# Patient Record
Sex: Male | Born: 1960
Health system: Southern US, Community
[De-identification: ages and names within clinical notes are randomized; demographics above are authoritative.]

## PROBLEM LIST (undated history)

## (undated) DIAGNOSIS — Z720 Tobacco use: Secondary | ICD-10-CM

## (undated) DIAGNOSIS — IMO0002 Reserved for concepts with insufficient information to code with codable children: Secondary | ICD-10-CM

## (undated) DIAGNOSIS — I10 Essential (primary) hypertension: Secondary | ICD-10-CM

## (undated) DIAGNOSIS — F1010 Alcohol abuse, uncomplicated: Secondary | ICD-10-CM

## (undated) DIAGNOSIS — K219 Gastro-esophageal reflux disease without esophagitis: Secondary | ICD-10-CM

## (undated) HISTORY — DX: Alcohol abuse, uncomplicated: F10.10

## (undated) HISTORY — DX: Gastro-esophageal reflux disease without esophagitis: K21.9

## (undated) HISTORY — PX: CHOLECYSTECTOMY: SHX55

## (undated) HISTORY — DX: Reserved for concepts with insufficient information to code with codable children: IMO0002

## (undated) HISTORY — DX: Tobacco use: Z72.0

## (undated) HISTORY — DX: Essential (primary) hypertension: I10

---

## 2002-05-14 ENCOUNTER — Encounter: Payer: Self-pay | Admitting: Orthopedic Surgery

## 2002-05-14 ENCOUNTER — Ambulatory Visit (HOSPITAL_COMMUNITY): Admission: RE | Admit: 2002-05-14 | Discharge: 2002-05-14 | Payer: Self-pay | Admitting: Orthopedic Surgery

## 2014-06-16 ENCOUNTER — Ambulatory Visit (INDEPENDENT_AMBULATORY_CARE_PROVIDER_SITE_OTHER): Payer: BC Managed Care – PPO | Admitting: Neurology

## 2014-06-16 ENCOUNTER — Encounter: Payer: Self-pay | Admitting: Neurology

## 2014-06-16 VITALS — BP 176/100 | HR 90 | Ht 72.84 in | Wt 277.1 lb

## 2014-06-16 DIAGNOSIS — IMO0002 Reserved for concepts with insufficient information to code with codable children: Secondary | ICD-10-CM

## 2014-06-16 DIAGNOSIS — F17200 Nicotine dependence, unspecified, uncomplicated: Secondary | ICD-10-CM

## 2014-06-16 DIAGNOSIS — M6250 Muscle wasting and atrophy, not elsewhere classified, unspecified site: Secondary | ICD-10-CM

## 2014-06-16 DIAGNOSIS — M5417 Radiculopathy, lumbosacral region: Secondary | ICD-10-CM

## 2014-06-16 DIAGNOSIS — G621 Alcoholic polyneuropathy: Secondary | ICD-10-CM

## 2014-06-16 DIAGNOSIS — R29898 Other symptoms and signs involving the musculoskeletal system: Secondary | ICD-10-CM

## 2014-06-16 LAB — CK: Total CK: 108 U/L (ref 7–232)

## 2014-06-16 LAB — VITAMIN B12: Vitamin B-12: 453 pg/mL (ref 211–911)

## 2014-06-16 LAB — COMPREHENSIVE METABOLIC PANEL
ALT: 35 U/L (ref 0–53)
AST: 31 U/L (ref 0–37)
Albumin: 4.1 g/dL (ref 3.5–5.2)
Alkaline Phosphatase: 83 U/L (ref 39–117)
BUN: 13 mg/dL (ref 6–23)
CO2: 27 mEq/L (ref 19–32)
Calcium: 8.8 mg/dL (ref 8.4–10.5)
Chloride: 104 mEq/L (ref 96–112)
Creat: 0.98 mg/dL (ref 0.50–1.35)
Glucose, Bld: 83 mg/dL (ref 70–99)
Potassium: 3.7 mEq/L (ref 3.5–5.3)
Sodium: 142 mEq/L (ref 135–145)
Total Bilirubin: 0.6 mg/dL (ref 0.2–1.2)
Total Protein: 7.2 g/dL (ref 6.0–8.3)

## 2014-06-16 LAB — SEDIMENTATION RATE: Sed Rate: 5 mm/hr (ref 0–16)

## 2014-06-16 LAB — CBC
HCT: 45.2 % (ref 39.0–52.0)
Hemoglobin: 15.9 g/dL (ref 13.0–17.0)
MCH: 32.3 pg (ref 26.0–34.0)
MCHC: 35.2 g/dL (ref 30.0–36.0)
MCV: 91.9 fL (ref 78.0–100.0)
Platelets: 222 10*3/uL (ref 150–400)
RBC: 4.92 MIL/uL (ref 4.22–5.81)
RDW: 14.2 % (ref 11.5–15.5)
WBC: 10.3 10*3/uL (ref 4.0–10.5)

## 2014-06-16 LAB — C-REACTIVE PROTEIN: CRP: 1 mg/dL — ABNORMAL HIGH (ref <0.60)

## 2014-06-16 LAB — TSH: TSH: 1.231 u[IU]/mL (ref 0.350–4.500)

## 2014-06-16 NOTE — Progress Notes (Signed)
Note faxed.

## 2014-06-16 NOTE — Progress Notes (Signed)
[facility] Clinic Note - Initial Visit   Date: 06/16/2014  Joel Klein MRN: [id number] DOB: [birth year]   Dear Dr. Lin Landsman:  Thank you for your kind referral of Joel Klein for consultation of bilateral leg weakness. Although his history is well known to you, please allow [place] to reiterate it for the purpose of our medical record. The patient was accompanied to the clinic by self.    History of Present Illness: Joel Klein is a 53 y.o. left-handed Caucasian male with history of lumbar disc herniation, GERD, hypertension, hyperlipidemia, tobacco use, and alcohol use presenting for evaluation of bilateral leg weakness and numbness/tingling.    Starting in the fall of 2014, he developed gradual onset of left hip pain and over several months developed bilateral leg weakness involving the thighs, lower legs, and feet (right > left).  He has muscle cramping and pressure like pain all over this legs and bones (shin, hips).  Early 2015, his weakness has steadily worsened.  He has difficulty walking on uneven surfaces.  He was falling about 3-4 times during the winter and starting using a cane in June 2015.  He feels that the cane helps and probably would have fallen more without using it.  When he has fallen, he has a very difficult time getting back up.   Around Spring 2015, he noticed numbness tingling of the feet and start loosing the ability to control foot movements.  He has mild weakness and numbness of the hands.  He has a tendency to drop things. He was previously working 6-days and went down to 5-day in April and in July cut back to 4-days per week.  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 He has not been in any motor vehicle accidents.  He does not have any problems swallowing or talking.  He has lost 35lb over the winter but attributes this to gall bladder infection and underwent cholecystectomy in April 2015.    In October 1992,  he developed sharp shooting back pain and was found to have lumbar disc herniation.  He has three steroid epidural injections which alleviated the pain.  There was no weakness at that time.  There is a strong personal history of alcohol and tobacco use. He reports drinking almost half a pint of liquor nightly for 30 years and has 120 pack year history. He is not interesting in quitting at this time.   Past Medical History  Diagnosis Date  . Herniated disc   . Hypertension   . GERD (gastroesophageal reflux disease)   . Tobacco abuse     Past Surgical History  Procedure Laterality Date  . Cholecystectomy       Medications:  No current outpatient prescriptions on file prior to visit.   No current facility-administered medications on file prior to visit.    Allergies:  Allergies  Allergen Reactions  . Penicillins     Family History: Family History  Problem Relation Age of Onset  . Cancer Maternal Aunt   . Congestive Heart Failure Mother     Deceased  . Stroke Father     Deceased  . Hypertension Brother   . Hypertension Brother     Social History: History   Social History  . Marital Status: Single    Spouse Name: N/A    Number of Children: 0  . Years of Education: N/A   Occupational History  . Not on file.   Social History Main  Topics  . Smoking status: Current Every Day Smoker -- 3.00 packs/day for 40 years    Types: Cigarettes  . Smokeless tobacco: Never Used  . Alcohol Use: Yes     Comment: Half-pint liquor nightly x 30  . Drug Use: No  . Sexual Activity: Not on file   Other Topics Concern  . Not on file   Social History Narrative   He lives alone.  No children.   He works as a Administrator.   Highest level of education:  9th grade    Review of Systems:  CONSTITUTIONAL: No fevers, chills, night sweats, or weight loss.   EYES: No visual changes or eye pain ENT: No hearing changes.  No history of nose bleeds.   RESPIRATORY: No cough, wheezing  and shortness of breath.   CARDIOVASCULAR: Negative for chest pain, and palpitations.   GI: Negative for abdominal discomfort, blood in stools or black stools.  No recent change in bowel habits.   GU:  No history of incontinence.   MUSCLOSKELETAL: +history of joint pain or swelling.  +myalgias.   SKIN: Negative for lesions, rash, and itching.   HEMATOLOGY/ONCOLOGY: Negative for prolonged bleeding, bruising easily, and swollen nodes.   ENDOCRINE: Negative for cold or heat intolerance, polydipsia or goiter.   PSYCH:  No depression or anxiety symptoms.   NEURO: As Above.   Vital Signs:  BP 176/100  Pulse 90  Ht 6' 0.83" (1.85 m)  Wt 277 lb 1 oz (125.675 kg)  BMI 36.72 kg/m2  SpO2 98%   General Medical Exam:   General:  Well appearing, comfortable.   Eyes/ENT: see cranial nerve examination.   Neck: No masses appreciated.  Full range of motion without tenderness.  No carotid bruits. Respiratory:  Reduced breath sounds at bases mild wheezes on the right.   Cardiac:  Regular rate and rhythm, no murmur.    Extremities:  No deformities, edema, or skin discoloration. Good capillary refill.   Skin:  Skin color, texture, turgor normal. No rashes or lesions.  Neurological Exam: MENTAL STATUS including orientation to time, place, person, recent and remote memory, attention span and concentration, language, and fund of knowledge is normal.  Speech is not dysarthric.  CRANIAL NERVES: II:  No visual field defects.  Unremarkable fundi.   III-IV-VI: Pupils equal round and reactive to light.  Normal conjugate, extra-ocular eye movements in all directions of gaze.  No nystagmus.  Subtle right ptosis.   V:  Normal facial sensation.  Jaw jerk is absent.   VII:  Normal facial symmetry and movements.  No pathologic facial reflexes.  VIII:  Normal hearing and vestibular function.   IX-X:  Normal palatal movement.   XI:  Normal shoulder shrug and head rotation.   XII:  Normal tongue strength and range  of motion, no deviation or fasciculation.  MOTOR:  Bilateral quadriceps atrophy.  No fasciculations or abnormal movements.  No pronator drift.     Right Upper Extremity:    Left Upper Extremity:    Deltoid  5/5   Deltoid  5/5   Biceps  5/5   Biceps  5/5   Triceps  5/5   Triceps  5/5   Wrist extensors  5/5   Wrist extensors  5/5   Wrist flexors  5-/5   Wrist flexors  5-/5   Finger extensors  5/5   Finger extensors  5/5   Finger flexors  5-/5   Finger flexors  5-/5   Dorsal interossei  4+/5   Dorsal interossei  4+/5   Abductor pollicis  5-/5   Abductor pollicis  5-/5   Tone (Ashworth scale)  0  Tone (Ashworth scale)  0   Right Lower Extremity:    Left Lower Extremity:    Hip flexors  4/5   Hip flexors  5-/5   Hip extensors  4+/5   Hip extensors  5/5   Abductor 5/5  Abductor 5/5  Adductor 4+/5  Adductor 4+/5  Knee flexors  4+/5   Knee flexors  5-/5   Knee extensors  5/5   Knee extensors  5/5   Dorsiflexors  3+/5   Dorsiflexors  3+/5   Plantarflexors  5/5   Plantarflexors  5/5   Toe extensors  3+/5   Toe extensors  4-/5   Toe flexors  5/5   Toe flexors  5/5   Tone (Ashworth scale)  0+  Tone (Ashworth scale)  0+   MSRs:  Right                                                                 Left brachioradialis 2+  brachioradialis 3+  biceps 2+  biceps 3+  triceps 2+  triceps 2+  patellar 3+  patellar 3+  ankle jerk 2+  ankle jerk 2+  Hoffman no  Hoffman no  plantar response down  plantar response down   SENSORY:  Vibration 80% at knees, 50% at ankle (worse on right), and absent at great toe.  Pin prick, temperature, and proprioception intact.  Romberg's sign mildly positive.   COORDINATION/GAIT: Normal finger-to- nose-finger.  Heel tapping intact.  Toe tapping severely reduced due to weakness.  With effort, he is able to rise from a chair without using arms.  Gait is antalgic, wide-based, and high steppage bilaterally.     IMPRESSION: Mr. Rinker is a 53 year old gentleman with  history of lumbar disc herniation, alcohol abuse, and tobacco use presenting for evaluation of bilateral leg weakness and paresthesias. His examination shows motor deficits involving the proximal and distal muscles of the lower extremity, worse on the right side. There is also generalized hyperreflexia throughout and increased tone in the legs. Vibration is diminished distal to the knees bilaterally. Taste in his history and exam, I suspect that he that he has both (1) lumbosacral myelopathy from central canal stenosis causing proximal leg weakness and (2) length dependent peripheral neuropathy due to chronic alcohol use causing distal glove stocking pattern of sensorimotor deficits.  Regarding his proximal leg weakness, I would like to obtain imaging of the lumbar spine as he has a history of disc herniations which was last imaged in the early 1990s.   Labs will be checked looking for treatable causes of neuropathy and screening studies for myopathy.  I had extensive discussion with the patient regarding the pathogenesis, etiology, management, and natural course of neuropathy. Neuropathy tends to be slowly progressive, especially if a treatable etiology is not identified. In his case, underlying etiology is most likely from chronic alcohol use and have strongly encouraged him to cut down on his consumption.  Going forward, I have discussed that his leg weakness although is not prohibiting him from driving at this time, it will likely be a topic of discussion in the future based  on the results of his tests. If this, indeed, is something hat is not reversible, we will need to discuss work restrictions.   PLAN/RECOMMENDATIONS:  1.  Check CBC, CMP, ESR, CRP, CK, aldolase, TSH, vitamin B1, vitamin B12, zinc, copper, 2-hr glucose tolerance test, SPEP/UPEP with IFE 2.  MRI lumbar spine wo contrast 3.  NCS/EMG right arm and leg - schedule at 3pm 4.  Strongly encouraged to stop drinking and smoking 5.   Recommend being very cautious with driving.  Discussed potential work restriction based on what comes of his testing 6.  Literature on fall precautions provided  7.  Discussed PT, however patient would like to hold off until workup is complete  8.  Return to clinic 6-weeks    The duration of this appointment visit was 60 minutes of face-to-face time with the patient.  Greater than 50% of this time was spent in counseling, explanation of diagnosis, planning of further management, and coordination of care.   Thank you for allowing me to participate in patient's care.  If I can answer any additional questions, I would be pleased to do so.    Sincerely,    Donika K. Posey Pronto, DO

## 2014-06-16 NOTE — Patient Instructions (Addendum)
1.  MRI lumbar spine wo contrast 2.  NCS/EMG right arm and leg - schedule at 3pm 3.  Check blood work  4.  Strongly encouraged to stop drinking and smoking 5.  Recommend being very cautious with driving 6.  Return to clinic [facility]  Preventing Falls in the Home   Falls are common, often dreaded events in the lives of older people. Aside from the obvious injuries and even death that may result, falls can cause wide-ranging consequences including loss of independence, mental decline, decreased activity, and mobility. Younger people are also at risk of falling, especially those with chronic illnesses and fatigue.  Ways to reduce the risk for falling:  * Examine diet and medications. Warm foods and alcohol dilate blood vessels, which can lead to dizziness when standing. Sleep aids, antidepressants, and pain medications can also increase the likelihood of a fall.  * Get a vison exam. Poor vision, cataracts, and glaucoma increase the chances of falling.  * Check foot gear. Shoes should fit snugly and have a sturdy, nonskid sole and broad, low heel.  * Participate in a physician-approved exercise program to build and maintain muscle strength and improve balance and coordination.  * Increase vitamin D intake. Vitamin D improves muscle strength and increases the amount of calcium the body is able to absorb and deposit in bones.  How to prevent falls from common hazards:  * Floors - Remove all loose wires, cords, and throw rugs. Minimize clutter. Make sure rugs are anchored and smooth. Keep furniture in its usual place.  * Chairs - Use chairs with straight backs, armrests, and firm seats. Add firm cushions to existing pieces to add height.  * Bathroom - Install grab bars and non-skid tape in the tub or shower. Use a bathtub transfer bench or a shower chair with a back support. Use an elevated toilet seat and/or safety rails to assist standing from a low surface. Do not use towel racks  or bathroom tissue holders to help you stand.  * Lighting - Make sure halls, stairways, and entrances are well-lit. Install a night light in your bathroom or hallway. Make sure there is a light switch at the top and bottom of the staircase. Turn lights on if you get up in the middle of the night. Make sure lamps or light switches are within reach of the bed if you have to get up during the night.  * Kitchen - Install non-skid rubber mats near the sink and stove. Clean spills immediately. Store frequently used utensils, pots, and pans between waist and eye level. This helps prevent reaching and bending. Sit when getting things out of the lower cupboards.  * Living room / Melissa furniture with wide spaces in between, giving enough room to move around. Establish a route through the living room that gives you something to hold onto as you walk.  * Stairs - Make sure treads, rails, and rugs are secure. Install a rail on both sides of the stairs. If stairs are a threat, it might be helpful to arrange most of your activities on the lower level to reduce the number of times you must climb the stairs.  * Entrances and doorways - Install metal handles on the walls adjacent to the doorknobs of all doors to make it more secure as you travel through the doorway.  Tips for maintaining balance:  * Keep at least one hand free at all times Try using a backpack  or fanny pack to hold things rather than carrying them in your hands. Never carry objects in both hands when walking as this interferes with keeping your balance.  * Attempt to swing both arms from front to back while walking. This might require a conscious effort if Parkinson's disease has diminished your movement. It will, however, help you to maintain balance and posture, and reduce fatigue.  * Consciously lift your feet off the ground when walking. Shuffling and dragging of the feet is a common culprit in losing your balance.  * When trying to navigate  turns, use a "U" technique of facing forward and making a wide turn, rather than pivoting sharply.  * Try to stand with your feet shoulder-length apart. When your feet are close together for any length of time, you increase your risk of losing your balance and falling.  * Do one thing at a time. Do not try to walk and accomplish another task, such as reading or looking around. The decrease in your automatic reflexes complicates motor function, so the less distraction, the better.  * Do not wear rubber or gripping soled shoes, they might "catch" on the floor and cause tripping.  * Move slowly when changing positions. Use deliberate, concentrated movements and, if needed, use a grab bar or walking aid. Count fifteen (15) seconds after standing to begin walking.  * If balance is a continuous problem, you might want to consider a walking aid such as a cane, walking stick, or walker. Once you have mastered walking with help, you may be ready to try it again on your own.  This information is provided by [facility] and is not intended to replace the medical advice of your physician or other health care providers. Please consult your physician or other health care providers for advice regarding your specific medical condition.

## 2014-06-19 ENCOUNTER — Other Ambulatory Visit: Payer: Self-pay | Admitting: *Deleted

## 2014-06-19 ENCOUNTER — Telehealth: Payer: Self-pay | Admitting: Family Medicine

## 2014-06-19 DIAGNOSIS — R29898 Other symptoms and signs involving the musculoskeletal system: Secondary | ICD-10-CM

## 2014-06-19 LAB — UIFE/LIGHT CHAINS/TP QN, 24-HR UR
Albumin, U: DETECTED
Alpha 1, Urine: DETECTED — AB
Alpha 2, Urine: DETECTED — AB
Beta, Urine: DETECTED — AB
FREE LAMBDA LT CHAINS, UR: 0.05 mg/dL (ref 0.02–0.67)
Free Kappa Lt Chains,Ur: 0.96 mg/dL (ref 0.14–2.42)
Free Kappa/Lambda Ratio: 19.2 ratio — ABNORMAL HIGH (ref 2.04–10.37)
GAMMA UR: DETECTED — AB
Total Protein, Urine: 3.5 mg/dL

## 2014-06-19 LAB — ZINC: ZINC: 71 ug/dL (ref 60–130)

## 2014-06-19 LAB — COPPER, SERUM: Copper: 121 ug/dL (ref 70–175)

## 2014-06-19 NOTE — Telephone Encounter (Signed)
Called patient to notify him of MRI appt scheduled @ [facility] on 7/30 @ 2:30pmo arrive at 2:00pm. I also notified him that he could go for the glucose test any day of the week he just needs to arrive @ 7:15am to outpatient lab @ [facility].

## 2014-06-20 LAB — SPEP & IFE WITH QIG
Albumin ELP: 54.8 % — ABNORMAL LOW (ref 55.8–66.1)
Alpha-1-Globulin: 5.1 % — ABNORMAL HIGH (ref 2.9–4.9)
Alpha-2-Globulin: 12.1 % — ABNORMAL HIGH (ref 7.1–11.8)
BETA 2: 5.2 % (ref 3.2–6.5)
Beta Globulin: 6.6 % (ref 4.7–7.2)
GAMMA GLOBULIN: 16.2 % (ref 11.1–18.8)
IGA: 289 mg/dL (ref 68–379)
IGM, SERUM: 36 mg/dL — AB (ref 41–251)
IgG (Immunoglobin G), Serum: 1060 mg/dL (ref 650–1600)
M-SPIKE, %: 0.21 g/dL
Total Protein, Serum Electrophoresis: 7.2 g/dL (ref 6.0–8.3)

## 2014-06-20 LAB — ALDOLASE: ALDOLASE: 8.5 U/L — AB (ref <=8.1)

## 2014-06-20 LAB — VITAMIN B1: Vitamin B1 (Thiamine): 8 nmol/L (ref 8–30)

## 2014-06-23 ENCOUNTER — Telehealth: Payer: Self-pay | Admitting: Neurology

## 2014-06-23 NOTE — Telephone Encounter (Signed)
MRI lumbar spine without contrast report received from [facility] dated 06/21/2014: Marked multilevel spondylosis of the lumbar spine with moderately severe to severe congenital and acquired central canal and lateral recess narrowing from T12-L1-L5-S1. There is also foraminal narrowing at L4-5 and L5-S1. Speckled appearance within the central canal at T11-12 mid L1 is likely due to CSF interposed between entrapped nerve root.  Called patient with the results of his blood testing thus far which shows monoclonal IgA kappa protein.  Going forward refer him to hematology for evaluation, however we'll complete electrodiagnostic testing first.  Donika K. Posey Pronto, DO

## 2014-07-03 ENCOUNTER — Ambulatory Visit (INDEPENDENT_AMBULATORY_CARE_PROVIDER_SITE_OTHER): Payer: BC Managed Care – PPO | Admitting: Neurology

## 2014-07-03 DIAGNOSIS — G621 Alcoholic polyneuropathy: Secondary | ICD-10-CM

## 2014-07-03 DIAGNOSIS — M6250 Muscle wasting and atrophy, not elsewhere classified, unspecified site: Secondary | ICD-10-CM

## 2014-07-03 DIAGNOSIS — R29898 Other symptoms and signs involving the musculoskeletal system: Secondary | ICD-10-CM

## 2014-07-03 DIAGNOSIS — F17200 Nicotine dependence, unspecified, uncomplicated: Secondary | ICD-10-CM

## 2014-07-03 DIAGNOSIS — M5417 Radiculopathy, lumbosacral region: Secondary | ICD-10-CM

## 2014-07-03 NOTE — Procedures (Signed)
[facility]  [place]  [place], [place] [address] Tel: [phone] Fax:  [phone] Test Date:  07/03/2014  Patient: Joel Klein DOB: February 27, 1961 Physician: Narda Amber, DO  Sex: Male Height: 6\' 1"  Ref Phys: Narda Amber  ID#: [id number] Temp: 32.0C Technician: Laureen Ochs R. NCS T.   Patient Complaints: This is a 53 year old gentleman presenting with bilateral feet paresthesias and leg weakness.   NCV & EMG Findings: Extensive electrodiagnostic testing of the right upper extremity and right lower extremity is somewhat limited due to patient's intolerance of testing.  Findings are as follows:  1. Median sensory response shows prolonged latency (3.8 ms) with preserved amplitude. The ulnar sensory response shows prolonged latency (3.5 ms) and reduced amplitude (5.1 V).  The dorsal cutaneous sensory response is also reduced in amplitude. Radial sensory response is within normal limits 2. The sural and superficial peroneal sensory responses are within normal limits. 3. The right ulnar motor nerve showed reduced amplitude (6.1 mV) and decreased conduction velocity (A Elbow-B Elbow, 38 m/s).  The median motor response is normal. 4. The peroneal motor response recording at both the extensor digitorum brevis and tibialis anterior is reduced.  Tibial motor responses within normal limits. 5. F Wave studies indicate that the right ulnar F wave has prolonged latency (36.27 ms).  All remaining F Wave latencies were within normal limits.   6. In the arms, chronic motor axon loss changes are seen affecting the abductor digiti minimi muscle, without accompanied active denervation. 7. In the legs, chronic motor axon loss changes are seen affecting predominantly L5-S1 myotomes, with active changes restricted to the medial gastrocnemius muscle.   Impression: 1. Right median neuropathy at or distal to the wrist, consistent with the clinical diagnosis of carpal tunnel syndrome.  Overall these findings are mild in degree electrically. 2. Right ulnar neuropathy at the elbow, demyelinating and axon loss in type. Overall, these findings are moderate in degree electrically. 3. Multilevel lumbosacral intraspinal canal lesions (i.e. radiculopathy, anterior horn cell, etc) predominantly affecting the right L5 > S1 nerve roots/segments, with active changes affecting medial gastrocnemius muscle.  These findings are moderately severe in degree. 4. There is no evidence of a generalized sensorimotor affecting the right side.   ___________________________ Narda Amber, DO    Nerve Conduction Studies Anti Sensory Summary Table   Site NR Peak (ms) Norm Peak (ms) P-T Amp (V) Norm P-T Amp  Right DorsCutan Anti Sensory (Dorsum 5th MC)  32C  Wrist    2.0 <3.1 6.8 >10  Right Median Anti Sensory (2nd Digit)  32C  Wrist    3.8 <3.6 25.2 >15  Right Radial Anti Sensory (Base 1st Digit)  32C  Wrist    1.9 <2.7 42.0 >14  Right Sup Peroneal Anti Sensory (Ant Lat Mall)  12 cm    2.7 <4.6 24.3 >4  Right Sural Anti Sensory (Lat Mall)  Calf    3.7 <4.6 13.2 >4  Right Ulnar Anti Sensory (5th Digit)  32C  Wrist    3.5 <3.1 5.1 >10   Motor Summary Table   Site NR Onset (ms) Norm Onset (ms) O-P Amp (mV) Norm O-P Amp Site1 Site2 Delta-0 (ms) Dist (cm) Vel (m/s) Norm Vel (m/s)  Right Median Motor (Abd Poll Brev)  32C  Wrist    4.0 <4.0 6.0 >6 Elbow Wrist 5.5 29.5 54 >50  Elbow    9.5  4.6         Right Peroneal Motor (  Ext Dig Brev)  32C  Ankle    6.0 <6.0 0.4 >2.5 B Fib Ankle 8.6 35.0 41 >40  B Fib    14.6  0.4  Poplt B Fib 2.3 10.0 43 >40  Poplt    16.9  0.4         Right Peroneal TA Motor (Tib Ant)  32C  Fib Head    4.3 <4.5 2.5 >3 Poplit Fib Head 2.1 10.0 48 >40  Poplit    6.4  2.1         Right Tibial Motor (Abd Hall Brev)  32C  Ankle    3.8 <6.0 8.0 >4 Knee Ankle 9.6 42.0 44 >40  Knee    13.4  5.3         Right Ulnar Motor (Abd Dig Minimi)  32C  Wrist    2.7 <3.1 6.1  >7 B Elbow Wrist 5.6 28.0 50 >50  B Elbow    8.3  5.4  A Elbow B Elbow 2.6 10.0 38 >50  A Elbow    10.9  4.6          F Wave Studies   NR F-Lat (ms) Lat Norm (ms) L-R F-Lat (ms)  Right Tibial (Mrkrs) (Abd Hallucis)  32C     34.46 <55   Right Ulnar (Mrkrs) (Abd Dig Min)  32C     36.27 <33    EMG   Side Muscle Ins Act Fibs Psw Fasc Number Recrt Dur Dur. Amp Amp. Poly Poly. Comment  Right 1stDorInt Nml Nml Nml Nml Nml Nml Nml Nml Nml Nml Nml Nml N/A  Right ABD Dig Min Nml Nml Nml Nml 1- Rapid Many 1+ Many 1+ Nml Nml N/A  Right PronatorTeres Nml Nml Nml Nml Nml Nml Nml Nml Nml Nml Nml Nml N/A  Right Abd Poll Brev Nml Nml Nml Nml Nml Nml Nml Nml Nml Nml Nml Nml N/A  Right AntTibialis 1+ Nml Nml Nml 2- Rapid Most 1+ Many 1+ Many 1+ N/A  Right Flex Dig Long Nml Nml Nml Nml 3- Rapid Most 1+ Most 1+ Nml Nml N/A  Right Gastroc Nml Nml 1+ Nml 1- Rapid Many 1+ Nml Nml Nml Nml N/A  Right GluteusMed Nml Nml Nml Nml 1- Rapid Many 1+ Few 1+ Nml Nml N/A  Right RectFemoris Nml Nml Nml Nml Nml Nml Nml Nml Nml Nml Nml Nml N/A      Waveforms:

## 2014-07-04 ENCOUNTER — Other Ambulatory Visit: Payer: Self-pay | Admitting: *Deleted

## 2014-07-04 DIAGNOSIS — M5417 Radiculopathy, lumbosacral region: Secondary | ICD-10-CM

## 2014-07-04 NOTE — Progress Notes (Signed)
Addendum  EMG of the right arm and leg 07/03/2014: 1.  Right median neuropathy at or distal to the wrist, consistent with the clinical diagnosis of carpal tunnel syndrome. Overall these findings are mild in degree electrically.  2.  Right ulnar neuropathy at the elbow, demyelinating and axon loss in type. Overall, these findings are moderate in degree electrically. 3.  Multilevel lumbosacral intraspinal canal lesions (i.e. radiculopathy, anterior horn cell, etc) predominantly affecting the right L5 > S1 nerve roots/segments, with active changes affecting medial gastrocnemius muscle. These findings are moderately severe in degree.  4.  There is no evidence of a generalized sensorimotor affecting the right side.  MRI lumbar spine without contrast report received from [facility] dated 06/21/2014:  Marked multilevel spondylosis of the lumbar spine with moderately severe to severe congenital and acquired central canal and lateral recess narrowing from T12-L1-L5-S1. There is also foraminal narrowing at L4-5 and L5-S1.  Speckled appearance within the central canal at T11-12 mid L1 is likely due to CSF interposed between entrapped nerve root.   Labs notable for monoclonal IgA kappa protein, which can be rechecked SPEP/UPEP in 69-months, given no evidence of neuropathy.   I have called and discussed patient's EMG, MRI lumbar spine, and serology results as noted above.  His leg weakness is due to central canal stenosis and foraminal narrowing of the lumbar spine, especially at L5-S1 nerve segments.  To my surprise, NCS showed normal sensory responses in the legs arguing against large fiber neuropathy causing distal weakness.   I would like him to start PT for leg strengthening and he would also like to surgical opinion.    Patient requesting referral to Dr. Ophelia Charter, MD with [facility] for surgical opinion.  Donika K. Posey Pronto, DO

## 2014-07-04 NOTE — Progress Notes (Signed)
PT ordered and referral form sent to [facility].

## 2014-07-05 ENCOUNTER — Telehealth: Payer: Self-pay | Admitting: *Deleted

## 2014-07-05 NOTE — Telephone Encounter (Signed)
1 

## 2014-07-05 NOTE — Telephone Encounter (Signed)
Becky with Dr. Adline Mango office called to let me know that patient is set up for an appointment on September 8 at 11:00.   Called patient to notify him.

## 2014-07-06 ENCOUNTER — Encounter: Payer: Self-pay | Admitting: Neurology

## 2014-07-14 ENCOUNTER — Encounter: Payer: Self-pay | Admitting: Neurology

## 2014-07-28 ENCOUNTER — Telehealth: Payer: Self-pay | Admitting: Neurology

## 2014-07-28 ENCOUNTER — Ambulatory Visit: Payer: BC Managed Care – PPO | Admitting: Neurology

## 2014-07-28 NOTE — Telephone Encounter (Signed)
Noted  

## 2014-07-28 NOTE — Telephone Encounter (Signed)
Pt called at 8:32AM on 07/28/14 stating that his truck will not crank and will not be able to make his appt for today on 07/28/14.

## 2014-07-28 NOTE — Telephone Encounter (Signed)
Pt no showed 07/28/14 appt with Dr. Posey Pronto.  Melissa Noon - please send no show letter to pt / Sherri S.

## 2014-08-11 ENCOUNTER — Other Ambulatory Visit: Payer: Self-pay | Admitting: Neurosurgery

## 2014-08-11 DIAGNOSIS — M48061 Spinal stenosis, lumbar region without neurogenic claudication: Secondary | ICD-10-CM

## 2014-08-11 DIAGNOSIS — R292 Abnormal reflex: Secondary | ICD-10-CM

## 2014-08-13 ENCOUNTER — Other Ambulatory Visit: Payer: BC Managed Care – PPO

## 2014-08-16 ENCOUNTER — Encounter: Payer: Self-pay | Admitting: Neurology

## 2014-08-19 ENCOUNTER — Other Ambulatory Visit: Payer: BC Managed Care – PPO

## 2014-08-21 ENCOUNTER — Other Ambulatory Visit: Payer: BC Managed Care – PPO

## 2014-08-24 ENCOUNTER — Encounter: Payer: BC Managed Care – PPO | Admitting: Neurology

## 2014-08-27 ENCOUNTER — Ambulatory Visit
Admission: RE | Admit: 2014-08-27 | Discharge: 2014-08-27 | Disposition: A | Discharge status: Home / Self Care | Payer: BC Managed Care – PPO | Source: Ambulatory Visit | Attending: Neurosurgery | Admitting: Neurosurgery

## 2014-08-27 DIAGNOSIS — R292 Abnormal reflex: Secondary | ICD-10-CM

## 2014-08-29 ENCOUNTER — Inpatient Hospital Stay
Admission: RE | Admit: 2014-08-29 | Discharge: 2014-08-29 | Disposition: A | Discharge status: Home / Self Care | Payer: BC Managed Care – PPO | Source: Ambulatory Visit | Attending: Neurosurgery | Admitting: Neurosurgery

## 2014-08-29 ENCOUNTER — Other Ambulatory Visit: Payer: BC Managed Care – PPO

## 2014-08-29 NOTE — Discharge Instructions (Signed)
Myelogram Discharge Instructions  1. Go home and rest quietly for the next 24 hours.  It is important to lie flat for the next 24 hours.  Get up only to go to the restroom.  You may lie in the bed or on a couch on your back, your stomach, your left side or your right side.  You may have one pillow under your head.  You may have pillows between your knees while you are on your side or under your knees while you are on your back.  2. DO NOT drive today.  Recline the seat as far back as it will go, while still wearing your seat belt, on the way home.  3. You may get up to go to the bathroom as needed.  You may sit up for 10 minutes to eat.  You may resume your normal diet and medications unless otherwise indicated.  Drink lots of extra fluids today and tomorrow.  4. The incidence of headache, nausea, or vomiting is about 5% (one in 20 patients).  If you develop a headache, lie flat and drink plenty of fluids until the headache goes away.  Caffeinated beverages may be helpful.  If you develop severe nausea and vomiting or a headache that does not go away with flat bed rest, call [phone].  5. You may resume normal activities after your 24 hours of bed rest is over; however, do not exert yourself strongly or do any heavy lifting tomorrow. If when you get up you have a headache when standing, go back to bed and force fluids for another 24 hours.  6. Call your physician for a follow-up appointment.  The results of your myelogram will be sent directly to your physician by the following day.  7. If you have any questions or if complications develop after you arrive home, please call [phone].  Discharge instructions have been explained to the patient.  The patient, or the person responsible for the patient, fully understands these instructions.      May resume Lexapro on Oct. 7, 2015, after 1:00 pm.

## 2014-09-01 ENCOUNTER — Other Ambulatory Visit: Payer: BC Managed Care – PPO

## 2014-11-20 ENCOUNTER — Ambulatory Visit
Admission: RE | Admit: 2014-11-20 | Discharge: 2014-11-20 | Disposition: A | Discharge status: Home / Self Care | Payer: BC Managed Care – PPO | Source: Ambulatory Visit | Attending: Neurosurgery | Admitting: Neurosurgery

## 2014-11-20 DIAGNOSIS — M48061 Spinal stenosis, lumbar region without neurogenic claudication: Secondary | ICD-10-CM

## 2014-11-20 MED ORDER — ONDANSETRON HCL 4 MG/2ML IJ SOLN
4.0000 mg | Freq: Once | INTRAMUSCULAR | Status: AC
  Administered 2014-11-20: 4 mg via INTRAMUSCULAR

## 2014-11-20 MED ORDER — MEPERIDINE HCL 100 MG/ML IJ SOLN
100.0000 mg | Freq: Once | INTRAMUSCULAR | Status: AC
  Administered 2014-11-20: 100 mg via INTRAMUSCULAR

## 2014-11-20 MED ORDER — DIAZEPAM 5 MG PO TABS
10.0000 mg | ORAL_TABLET | Freq: Once | ORAL | Status: AC
  Administered 2014-11-20: 10 mg via ORAL

## 2014-11-20 MED ORDER — IOHEXOL 180 MG/ML  SOLN
15.0000 mL | Freq: Once | INTRAMUSCULAR | Status: AC | PRN
  Administered 2014-11-20: 15 mL via INTRATHECAL

## 2014-11-20 NOTE — Discharge Instructions (Signed)

## 2017-01-05 DIAGNOSIS — R0602 Shortness of breath: Secondary | ICD-10-CM | POA: Diagnosis not present

## 2017-01-06 DIAGNOSIS — M1990 Unspecified osteoarthritis, unspecified site: Secondary | ICD-10-CM | POA: Diagnosis not present

## 2017-01-06 DIAGNOSIS — I509 Heart failure, unspecified: Secondary | ICD-10-CM | POA: Diagnosis not present

## 2017-01-06 DIAGNOSIS — J90 Pleural effusion, not elsewhere classified: Secondary | ICD-10-CM | POA: Diagnosis not present

## 2017-01-06 DIAGNOSIS — R601 Generalized edema: Secondary | ICD-10-CM | POA: Diagnosis not present

## 2017-01-12 DIAGNOSIS — I517 Cardiomegaly: Secondary | ICD-10-CM | POA: Diagnosis not present

## 2017-01-12 DIAGNOSIS — I5033 Acute on chronic diastolic (congestive) heart failure: Secondary | ICD-10-CM | POA: Diagnosis not present

## 2017-01-12 DIAGNOSIS — I10 Essential (primary) hypertension: Secondary | ICD-10-CM | POA: Diagnosis not present

## 2017-01-12 DIAGNOSIS — F1099 Alcohol use, unspecified with unspecified alcohol-induced disorder: Secondary | ICD-10-CM | POA: Diagnosis not present

## 2017-01-12 DIAGNOSIS — F17200 Nicotine dependence, unspecified, uncomplicated: Secondary | ICD-10-CM | POA: Diagnosis not present

## 2017-01-12 DIAGNOSIS — I509 Heart failure, unspecified: Secondary | ICD-10-CM | POA: Diagnosis not present

## 2017-01-12 DIAGNOSIS — R0789 Other chest pain: Secondary | ICD-10-CM | POA: Diagnosis not present

## 2017-01-19 DIAGNOSIS — F1099 Alcohol use, unspecified with unspecified alcohol-induced disorder: Secondary | ICD-10-CM | POA: Diagnosis not present

## 2017-01-19 DIAGNOSIS — F17200 Nicotine dependence, unspecified, uncomplicated: Secondary | ICD-10-CM | POA: Diagnosis not present

## 2017-01-19 DIAGNOSIS — I10 Essential (primary) hypertension: Secondary | ICD-10-CM | POA: Diagnosis not present

## 2017-01-19 DIAGNOSIS — R0789 Other chest pain: Secondary | ICD-10-CM | POA: Diagnosis not present

## 2017-01-19 DIAGNOSIS — I5033 Acute on chronic diastolic (congestive) heart failure: Secondary | ICD-10-CM | POA: Diagnosis not present

## 2017-01-19 DIAGNOSIS — E876 Hypokalemia: Secondary | ICD-10-CM | POA: Diagnosis not present

## 2017-01-19 DIAGNOSIS — I509 Heart failure, unspecified: Secondary | ICD-10-CM | POA: Diagnosis not present

## 2017-01-19 DIAGNOSIS — I517 Cardiomegaly: Secondary | ICD-10-CM | POA: Diagnosis not present

## 2017-02-12 DIAGNOSIS — I10 Essential (primary) hypertension: Secondary | ICD-10-CM | POA: Diagnosis not present

## 2017-02-12 DIAGNOSIS — I2510 Atherosclerotic heart disease of native coronary artery without angina pectoris: Secondary | ICD-10-CM | POA: Diagnosis not present

## 2017-02-12 DIAGNOSIS — Z6833 Body mass index (BMI) 33.0-33.9, adult: Secondary | ICD-10-CM | POA: Diagnosis not present

## 2017-02-12 DIAGNOSIS — F1099 Alcohol use, unspecified with unspecified alcohol-induced disorder: Secondary | ICD-10-CM | POA: Diagnosis not present

## 2017-02-12 DIAGNOSIS — I5033 Acute on chronic diastolic (congestive) heart failure: Secondary | ICD-10-CM | POA: Diagnosis not present

## 2017-02-12 DIAGNOSIS — R0789 Other chest pain: Secondary | ICD-10-CM | POA: Diagnosis not present

## 2017-02-12 DIAGNOSIS — F17200 Nicotine dependence, unspecified, uncomplicated: Secondary | ICD-10-CM | POA: Diagnosis not present

## 2017-02-12 DIAGNOSIS — I517 Cardiomegaly: Secondary | ICD-10-CM | POA: Diagnosis not present

## 2017-04-09 DIAGNOSIS — I2510 Atherosclerotic heart disease of native coronary artery without angina pectoris: Secondary | ICD-10-CM | POA: Diagnosis not present

## 2017-04-09 DIAGNOSIS — Z6832 Body mass index (BMI) 32.0-32.9, adult: Secondary | ICD-10-CM | POA: Diagnosis not present

## 2017-04-09 DIAGNOSIS — R0609 Other forms of dyspnea: Secondary | ICD-10-CM | POA: Diagnosis not present

## 2017-04-09 DIAGNOSIS — I10 Essential (primary) hypertension: Secondary | ICD-10-CM | POA: Diagnosis not present

## 2017-04-09 DIAGNOSIS — I5033 Acute on chronic diastolic (congestive) heart failure: Secondary | ICD-10-CM | POA: Diagnosis not present

## 2017-04-09 DIAGNOSIS — I517 Cardiomegaly: Secondary | ICD-10-CM | POA: Diagnosis not present

## 2017-04-13 DIAGNOSIS — R1904 Left lower quadrant abdominal swelling, mass and lump: Secondary | ICD-10-CM | POA: Diagnosis not present

## 2017-04-13 DIAGNOSIS — J209 Acute bronchitis, unspecified: Secondary | ICD-10-CM | POA: Diagnosis not present

## 2017-04-15 DIAGNOSIS — I517 Cardiomegaly: Secondary | ICD-10-CM | POA: Diagnosis not present

## 2017-04-15 DIAGNOSIS — I10 Essential (primary) hypertension: Secondary | ICD-10-CM | POA: Diagnosis not present

## 2017-04-15 DIAGNOSIS — I5033 Acute on chronic diastolic (congestive) heart failure: Secondary | ICD-10-CM | POA: Diagnosis not present

## 2017-04-15 DIAGNOSIS — I2510 Atherosclerotic heart disease of native coronary artery without angina pectoris: Secondary | ICD-10-CM | POA: Diagnosis not present

## 2017-04-15 DIAGNOSIS — R0609 Other forms of dyspnea: Secondary | ICD-10-CM | POA: Diagnosis not present

## 2017-04-21 DIAGNOSIS — I2510 Atherosclerotic heart disease of native coronary artery without angina pectoris: Secondary | ICD-10-CM | POA: Diagnosis not present

## 2017-08-03 ENCOUNTER — Other Ambulatory Visit: Payer: Self-pay | Admitting: Pharmacy Technician

## 2017-08-03 NOTE — Patient Outreach (Signed)
[place] [facility]) Care Management  08/03/2017  Joel Klein July 27, 1961 [id number]   Contacted patient in regards to Lisinopril/HCTZ medication adherence. No answer, left appropriate HIPAA voicemail.  Maud Deed [place], [place] Management [phone]

## 2017-08-03 NOTE — Patient Outreach (Signed)
[place] [facility]) Care Management  08/03/2017  Joel Klein 1961/08/29 [id number]   Contacted [facility] to have refill requests sent to Dr. Lin Landsman for 3 month supplies. Chris Warehouse manager) stated he would send out requests.  Maud Deed [place], [place] Management [phone]

## 2017-08-03 NOTE — Patient Outreach (Signed)
[place] [facility]) Care Management  08/03/2017  Jayten Gabbard 28-Nov-1960 [id number]  Patient returned my call. Hippa verified. Patient verified that he takes his Lisinopril- HCTZ daily without any issues. Patient states he is not interested in Mail Order however is ok with me contacting [facility] and having them request 3 month scripts on his maintenance meds.  Maud Deed [place], [place] Management [phone]

## 2017-08-04 ENCOUNTER — Encounter: Payer: Self-pay | Admitting: Pharmacy Technician

## 2017-09-14 DIAGNOSIS — Z125 Encounter for screening for malignant neoplasm of prostate: Secondary | ICD-10-CM | POA: Diagnosis not present

## 2017-09-14 DIAGNOSIS — R5383 Other fatigue: Secondary | ICD-10-CM | POA: Diagnosis not present

## 2017-09-14 DIAGNOSIS — I10 Essential (primary) hypertension: Secondary | ICD-10-CM | POA: Diagnosis not present

## 2017-09-14 DIAGNOSIS — Z1339 Encounter for screening examination for other mental health and behavioral disorders: Secondary | ICD-10-CM | POA: Diagnosis not present

## 2017-09-14 DIAGNOSIS — E7800 Pure hypercholesterolemia, unspecified: Secondary | ICD-10-CM | POA: Diagnosis not present

## 2017-09-14 DIAGNOSIS — R600 Localized edema: Secondary | ICD-10-CM | POA: Diagnosis not present

## 2017-09-14 DIAGNOSIS — Z23 Encounter for immunization: Secondary | ICD-10-CM | POA: Diagnosis not present

## 2017-09-14 DIAGNOSIS — Z6834 Body mass index (BMI) 34.0-34.9, adult: Secondary | ICD-10-CM | POA: Diagnosis not present

## 2017-09-14 DIAGNOSIS — I259 Chronic ischemic heart disease, unspecified: Secondary | ICD-10-CM | POA: Diagnosis not present

## 2017-09-14 DIAGNOSIS — Z79899 Other long term (current) drug therapy: Secondary | ICD-10-CM | POA: Diagnosis not present

## 2017-09-21 DIAGNOSIS — Z6834 Body mass index (BMI) 34.0-34.9, adult: Secondary | ICD-10-CM | POA: Diagnosis not present

## 2017-09-21 DIAGNOSIS — Z1331 Encounter for screening for depression: Secondary | ICD-10-CM | POA: Diagnosis not present

## 2017-09-21 DIAGNOSIS — I10 Essential (primary) hypertension: Secondary | ICD-10-CM | POA: Diagnosis not present

## 2017-09-21 DIAGNOSIS — Z79899 Other long term (current) drug therapy: Secondary | ICD-10-CM | POA: Diagnosis not present

## 2017-09-21 DIAGNOSIS — D649 Anemia, unspecified: Secondary | ICD-10-CM | POA: Diagnosis not present

## 2017-09-29 DIAGNOSIS — I10 Essential (primary) hypertension: Secondary | ICD-10-CM | POA: Diagnosis not present

## 2017-09-29 DIAGNOSIS — D509 Iron deficiency anemia, unspecified: Secondary | ICD-10-CM | POA: Diagnosis not present

## 2017-09-29 DIAGNOSIS — Z6834 Body mass index (BMI) 34.0-34.9, adult: Secondary | ICD-10-CM | POA: Diagnosis not present

## 2017-12-11 DIAGNOSIS — F17200 Nicotine dependence, unspecified, uncomplicated: Secondary | ICD-10-CM | POA: Diagnosis not present

## 2017-12-11 DIAGNOSIS — J019 Acute sinusitis, unspecified: Secondary | ICD-10-CM | POA: Diagnosis not present

## 2017-12-11 DIAGNOSIS — R112 Nausea with vomiting, unspecified: Secondary | ICD-10-CM | POA: Diagnosis not present

## 2017-12-11 DIAGNOSIS — J329 Chronic sinusitis, unspecified: Secondary | ICD-10-CM | POA: Diagnosis not present

## 2018-06-11 ENCOUNTER — Telehealth: Payer: Self-pay | Admitting: Licensed Clinical Social Worker

## 2018-06-11 DIAGNOSIS — F489 Nonpsychotic mental disorder, unspecified: Secondary | ICD-10-CM

## 2018-06-11 DIAGNOSIS — Z0389 Encounter for observation for other suspected diseases and conditions ruled out: Principal | ICD-10-CM

## 2018-06-11 NOTE — BH Specialist Note (Signed)
This [facility] specialist left message to call back with name and contact information. 

## 2018-07-07 DIAGNOSIS — K7030 Alcoholic cirrhosis of liver without ascites: Secondary | ICD-10-CM | POA: Diagnosis not present

## 2018-07-07 DIAGNOSIS — K922 Gastrointestinal hemorrhage, unspecified: Secondary | ICD-10-CM | POA: Diagnosis not present

## 2018-07-07 DIAGNOSIS — I8510 Secondary esophageal varices without bleeding: Secondary | ICD-10-CM | POA: Diagnosis not present

## 2018-07-07 DIAGNOSIS — K921 Melena: Secondary | ICD-10-CM | POA: Diagnosis not present

## 2018-07-07 DIAGNOSIS — I130 Hypertensive heart and chronic kidney disease with heart failure and stage 1 through stage 4 chronic kidney disease, or unspecified chronic kidney disease: Secondary | ICD-10-CM | POA: Diagnosis not present

## 2018-07-07 DIAGNOSIS — N200 Calculus of kidney: Secondary | ICD-10-CM | POA: Diagnosis not present

## 2018-07-07 DIAGNOSIS — I8500 Esophageal varices without bleeding: Secondary | ICD-10-CM | POA: Diagnosis not present

## 2018-07-07 DIAGNOSIS — I110 Hypertensive heart disease with heart failure: Secondary | ICD-10-CM | POA: Diagnosis not present

## 2018-07-07 DIAGNOSIS — E861 Hypovolemia: Secondary | ICD-10-CM | POA: Diagnosis not present

## 2018-07-07 DIAGNOSIS — I5032 Chronic diastolic (congestive) heart failure: Secondary | ICD-10-CM | POA: Diagnosis not present

## 2018-07-07 DIAGNOSIS — D509 Iron deficiency anemia, unspecified: Secondary | ICD-10-CM | POA: Diagnosis not present

## 2018-07-07 DIAGNOSIS — A084 Viral intestinal infection, unspecified: Secondary | ICD-10-CM | POA: Diagnosis not present

## 2018-07-07 DIAGNOSIS — N181 Chronic kidney disease, stage 1: Secondary | ICD-10-CM | POA: Diagnosis not present

## 2018-07-07 DIAGNOSIS — N182 Chronic kidney disease, stage 2 (mild): Secondary | ICD-10-CM | POA: Diagnosis not present

## 2018-07-07 DIAGNOSIS — D649 Anemia, unspecified: Secondary | ICD-10-CM | POA: Diagnosis not present

## 2018-07-07 DIAGNOSIS — R935 Abnormal findings on diagnostic imaging of other abdominal regions, including retroperitoneum: Secondary | ICD-10-CM | POA: Diagnosis not present

## 2018-07-07 DIAGNOSIS — K6289 Other specified diseases of anus and rectum: Secondary | ICD-10-CM | POA: Diagnosis not present

## 2018-07-07 DIAGNOSIS — R05 Cough: Secondary | ICD-10-CM | POA: Diagnosis not present

## 2018-07-07 DIAGNOSIS — K3189 Other diseases of stomach and duodenum: Secondary | ICD-10-CM | POA: Diagnosis not present

## 2018-07-07 DIAGNOSIS — I129 Hypertensive chronic kidney disease with stage 1 through stage 4 chronic kidney disease, or unspecified chronic kidney disease: Secondary | ICD-10-CM | POA: Diagnosis not present

## 2018-07-07 DIAGNOSIS — F1010 Alcohol abuse, uncomplicated: Secondary | ICD-10-CM | POA: Diagnosis not present

## 2018-07-07 DIAGNOSIS — D124 Benign neoplasm of descending colon: Secondary | ICD-10-CM | POA: Diagnosis not present

## 2018-07-07 DIAGNOSIS — Z791 Long term (current) use of non-steroidal anti-inflammatories (NSAID): Secondary | ICD-10-CM | POA: Diagnosis not present

## 2018-07-07 DIAGNOSIS — R601 Generalized edema: Secondary | ICD-10-CM | POA: Diagnosis not present

## 2018-07-07 DIAGNOSIS — K5520 Angiodysplasia of colon without hemorrhage: Secondary | ICD-10-CM | POA: Diagnosis not present

## 2018-07-07 DIAGNOSIS — R1110 Vomiting, unspecified: Secondary | ICD-10-CM | POA: Diagnosis not present

## 2018-07-07 DIAGNOSIS — R188 Other ascites: Secondary | ICD-10-CM | POA: Diagnosis not present

## 2018-07-07 DIAGNOSIS — Z7409 Other reduced mobility: Secondary | ICD-10-CM | POA: Diagnosis not present

## 2018-07-07 DIAGNOSIS — D125 Benign neoplasm of sigmoid colon: Secondary | ICD-10-CM | POA: Diagnosis not present

## 2018-07-07 DIAGNOSIS — K766 Portal hypertension: Secondary | ICD-10-CM | POA: Diagnosis not present

## 2018-07-07 DIAGNOSIS — D123 Benign neoplasm of transverse colon: Secondary | ICD-10-CM | POA: Diagnosis not present

## 2018-07-07 DIAGNOSIS — D696 Thrombocytopenia, unspecified: Secondary | ICD-10-CM | POA: Diagnosis not present

## 2018-07-07 DIAGNOSIS — N179 Acute kidney failure, unspecified: Secondary | ICD-10-CM | POA: Diagnosis not present

## 2018-07-07 DIAGNOSIS — K7031 Alcoholic cirrhosis of liver with ascites: Secondary | ICD-10-CM | POA: Diagnosis not present

## 2018-07-07 DIAGNOSIS — R195 Other fecal abnormalities: Secondary | ICD-10-CM | POA: Diagnosis not present

## 2018-07-07 DIAGNOSIS — K529 Noninfective gastroenteritis and colitis, unspecified: Secondary | ICD-10-CM | POA: Diagnosis not present

## 2018-07-07 DIAGNOSIS — K7460 Unspecified cirrhosis of liver: Secondary | ICD-10-CM | POA: Diagnosis not present

## 2018-07-07 DIAGNOSIS — R197 Diarrhea, unspecified: Secondary | ICD-10-CM | POA: Diagnosis not present

## 2018-07-20 DIAGNOSIS — K7460 Unspecified cirrhosis of liver: Secondary | ICD-10-CM | POA: Diagnosis not present

## 2018-07-20 DIAGNOSIS — I259 Chronic ischemic heart disease, unspecified: Secondary | ICD-10-CM | POA: Diagnosis not present

## 2018-07-20 DIAGNOSIS — Z6838 Body mass index (BMI) 38.0-38.9, adult: Secondary | ICD-10-CM | POA: Diagnosis not present

## 2018-07-20 DIAGNOSIS — I10 Essential (primary) hypertension: Secondary | ICD-10-CM | POA: Diagnosis not present

## 2018-07-20 DIAGNOSIS — Z720 Tobacco use: Secondary | ICD-10-CM | POA: Diagnosis not present

## 2018-07-20 DIAGNOSIS — Z7901 Long term (current) use of anticoagulants: Secondary | ICD-10-CM | POA: Diagnosis not present

## 2018-07-28 DIAGNOSIS — I10 Essential (primary) hypertension: Secondary | ICD-10-CM | POA: Diagnosis not present

## 2018-07-29 DIAGNOSIS — R600 Localized edema: Secondary | ICD-10-CM | POA: Diagnosis not present

## 2018-07-29 DIAGNOSIS — N400 Enlarged prostate without lower urinary tract symptoms: Secondary | ICD-10-CM | POA: Diagnosis not present

## 2018-07-29 DIAGNOSIS — Z6834 Body mass index (BMI) 34.0-34.9, adult: Secondary | ICD-10-CM | POA: Diagnosis not present

## 2018-07-29 DIAGNOSIS — I259 Chronic ischemic heart disease, unspecified: Secondary | ICD-10-CM | POA: Diagnosis not present

## 2018-07-29 DIAGNOSIS — K7030 Alcoholic cirrhosis of liver without ascites: Secondary | ICD-10-CM | POA: Diagnosis not present

## 2018-07-30 DIAGNOSIS — R600 Localized edema: Secondary | ICD-10-CM | POA: Diagnosis not present

## 2018-08-04 DIAGNOSIS — I10 Essential (primary) hypertension: Secondary | ICD-10-CM | POA: Diagnosis not present

## 2018-08-09 DIAGNOSIS — Z01818 Encounter for other preprocedural examination: Secondary | ICD-10-CM | POA: Diagnosis not present

## 2018-08-09 DIAGNOSIS — K7469 Other cirrhosis of liver: Secondary | ICD-10-CM | POA: Diagnosis not present

## 2018-08-25 ENCOUNTER — Encounter: Payer: Self-pay | Admitting: Cardiology

## 2018-08-25 ENCOUNTER — Ambulatory Visit: Payer: Medicare HMO | Admitting: Cardiology

## 2018-08-25 DIAGNOSIS — I2510 Atherosclerotic heart disease of native coronary artery without angina pectoris: Secondary | ICD-10-CM | POA: Diagnosis not present

## 2018-08-25 DIAGNOSIS — K7031 Alcoholic cirrhosis of liver with ascites: Secondary | ICD-10-CM

## 2018-08-25 DIAGNOSIS — K7460 Unspecified cirrhosis of liver: Secondary | ICD-10-CM | POA: Insufficient documentation

## 2018-08-25 DIAGNOSIS — I5032 Chronic diastolic (congestive) heart failure: Secondary | ICD-10-CM

## 2018-08-25 DIAGNOSIS — I5030 Unspecified diastolic (congestive) heart failure: Secondary | ICD-10-CM | POA: Insufficient documentation

## 2018-08-25 MED ORDER — FUROSEMIDE 20 MG PO TABS: 20.0000 mg | ORAL_TABLET | Freq: Every day | ORAL | 3 refills | Status: AC

## 2018-08-25 MED ORDER — HYDROCHLOROTHIAZIDE 12.5 MG PO CAPS: 12.5000 mg | ORAL_CAPSULE | Freq: Every day | ORAL | 3 refills | Status: DC

## 2018-08-25 NOTE — Patient Instructions (Signed)

## 2018-08-25 NOTE — Progress Notes (Signed)
Cardiology Office Note:    Date:  08/25/2018   ID:  Joel Klein, DOB 04-30-1961, MRN [id number]  PCP:  Joel Sheriff, Klein  Cardiologist:  Joel Klein    Referring Klein: Joel Sheriff, Klein   Chief Complaint  Patient presents with  . Follow up per Joel Klein  Doing better  History of Present Illness:    Joel Klein is a 57 y.o. male with diastolic congestive heart failure.  Also some remote history of coronary artery disease.  He was recently in [facility] hospital because of ascites.  He was diuresed appropriately.  He was also identified to have cirrhosis of the liver.  Since the time of discharge from hospital he is doing well complain of having a lot of back pain but otherwise no shortness of breath no swelling of lower extremities no ascites anymore.  Past Medical History:  Diagnosis Date  . Alcohol abuse   . GERD (gastroesophageal reflux disease)   . Herniated disc   . Hypertension   . Tobacco abuse       Current Medications: Current Meds  Medication Sig  . Fe Fum-FA-B Cmp-C-Zn-Mg-Mn-Cu (HEMOCYTE PLUS) 106-1 MG CAPS Take 1 tablet by mouth daily.  . furosemide (LASIX) 20 MG tablet Take 20 mg by mouth daily.   . hydrochlorothiazide (MICROZIDE) 12.5 MG capsule Take 1 capsule by mouth daily.  Marland Kitchen lisinopril (PRINIVIL,ZESTRIL) 40 MG tablet Take 40 mg by mouth daily.   Marland Kitchen omeprazole (PRILOSEC) 40 MG capsule Take 40 mg by mouth daily.   . potassium chloride (K-DUR) 10 MEQ tablet Take 1 tablet by mouth daily.     Allergies:   Penicillins   Social History   Socioeconomic History  . Marital status: Single    Spouse name: Not on file  . Number of children: 0  . Years of education: Not on file  . Highest education level: Not on file  Occupational History  . Not on file  Social Needs  . Financial resource strain: Not on file  . Food insecurity:    Worry: Not on file    Inability: Not on file  . Transportation needs:    Medical: Not on file    Non-medical: Not on file  Tobacco Use  . Smoking status: Current Every Day Smoker    Packs/day: 3.00    Years: 40.00    Pack years: 120.00    Types: Cigarettes  . Smokeless tobacco: Never Used  Substance and Sexual Activity  . Alcohol use: Not Currently    Comment: No Alcohol in 2 months  . Drug use: No  . Sexual activity: Not on file  Lifestyle  . Physical activity:    Days per week: Not on file    Minutes per session: Not on file  . Stress: Not on file  Relationships  . Social connections:    Talks on phone: Not on file    Gets together: Not on file    Attends religious service: Not on file    Active member of club or organization: Not on file    Attends meetings of clubs or organizations: Not on file    Relationship status: Not on file  Other Topics Concern  . Not on file  Social History Narrative   He lives alone.  No children.   He works as a Administrator.   Highest level of education:  9th grade     Family History: The patient's family history  includes Cancer in his maternal aunt; Congestive Heart Failure in his mother; Hypertension in his brother and brother; Stroke in his father. ROS:   Please see the history of present illness.    All 14 point review of systems negative except as described per history of present illness  EKGs/Labs/Other Studies Reviewed:   Summary Normal left ventricular size and systolic function with no appreciable segmental abnormality. Ejection fraction is visually estimated at >70% Mild aortic regurgitation. Mild tricuspid regurgitation. Mild pulmonary hypertension. Mild mitral regurgitation.  1. Diffuse large bowel wall thickening with intramural fatty deposition. Findings are compatible with acute on chronic colitis. No perforation or pneumatosis identified. 2. Morphologic features of liver compatible with cirrhosis. Ascites in upper abdominal varicosities suggest portal venous hypertension. 3. Nonobstructing left renal  calculi 4. Aortic Atherosclerosis (ICD10-I70.0). 5. Anasarca with body wall edema and edema within the mesentery.  Recent Labs: No results found for requested labs within last 8760 hours.  Recent Lipid Panel No results found for: CHOL, TRIG, HDL, CHOLHDL, VLDL, LDLCALC, LDLDIRECT  Physical Exam:    VS:  BP 130/64   Pulse 84   Ht 5\' 9"  (1.753 m)   Wt 271 lb 6.4 oz (123.1 kg)   SpO2 99%   BMI 40.08 kg/m     Wt Readings from Last 3 Encounters:  08/25/18 271 lb 6.4 oz (123.1 kg)  06/16/14 277 lb 1 oz (125.7 kg)     GEN:  Well nourished, well developed in no acute distress HEENT: Normal NECK: No JVD; No carotid bruits LYMPHATICS: No lymphadenopathy CARDIAC: RRR, no murmurs, no rubs, no gallops RESPIRATORY:  Clear to auscultation without rales, wheezing or rhonchi  ABDOMEN: Soft, non-tender, non-distended MUSCULOSKELETAL:  No edema; No deformity  SKIN: Warm and dry LOWER EXTREMITIES: no swelling NEUROLOGIC:  Alert and oriented x 3 PSYCHIATRIC:  Normal affect   ASSESSMENT:    1. Coronary artery disease involving native coronary artery of native heart without angina pectoris   2. Chronic diastolic congestive heart failure, NYHA class 2 ([place])   3. Alcoholic cirrhosis of liver with ascites ([place])    PLAN:    In order of problems listed above:  1. Coronary artery disease stable denies having symptoms. 2. Chronic diastolic congestive heart failure appears to be compensated we will give him a refill of furosemide hydrochlorothiazide as well as potassium and lisinopril.  His echocardiogram during last hospitalization showed preserved left ventricular ejection fraction. 3. Alcoholic cirrhosis: He is scheduled to see Dr. Odie Sera to discuss this.  Overall he appears to be hemodynamically compensated we will continue present management see him back in my office in about 3 months.   Medication Adjustments/Labs and Tests Ordered: Current medicines are reviewed at length with  the patient today.  Concerns regarding medicines are outlined above.  No orders of the defined types were placed in this encounter.  Medication changes: No orders of the defined types were placed in this encounter.   Signed, Joel Liter, Klein, [facility] 08/25/2018 12:19 PM    [place]

## 2018-09-08 DIAGNOSIS — K7469 Other cirrhosis of liver: Secondary | ICD-10-CM | POA: Diagnosis not present

## 2018-09-08 DIAGNOSIS — Z23 Encounter for immunization: Secondary | ICD-10-CM | POA: Diagnosis not present

## 2018-09-10 DIAGNOSIS — M5136 Other intervertebral disc degeneration, lumbar region: Secondary | ICD-10-CM | POA: Diagnosis not present

## 2018-09-10 DIAGNOSIS — J449 Chronic obstructive pulmonary disease, unspecified: Secondary | ICD-10-CM | POA: Diagnosis not present

## 2018-09-10 DIAGNOSIS — G894 Chronic pain syndrome: Secondary | ICD-10-CM | POA: Diagnosis not present

## 2018-09-10 DIAGNOSIS — M170 Bilateral primary osteoarthritis of knee: Secondary | ICD-10-CM | POA: Diagnosis not present

## 2018-09-10 DIAGNOSIS — Z720 Tobacco use: Secondary | ICD-10-CM | POA: Diagnosis not present

## 2018-09-10 DIAGNOSIS — K7460 Unspecified cirrhosis of liver: Secondary | ICD-10-CM | POA: Diagnosis not present

## 2018-09-10 DIAGNOSIS — E663 Overweight: Secondary | ICD-10-CM | POA: Diagnosis not present

## 2018-09-10 DIAGNOSIS — M159 Polyosteoarthritis, unspecified: Secondary | ICD-10-CM | POA: Diagnosis not present

## 2018-10-01 DIAGNOSIS — M545 Low back pain: Secondary | ICD-10-CM | POA: Diagnosis not present

## 2018-10-07 DIAGNOSIS — Z9119 Patient's noncompliance with other medical treatment and regimen: Secondary | ICD-10-CM | POA: Diagnosis not present

## 2018-10-07 DIAGNOSIS — K7030 Alcoholic cirrhosis of liver without ascites: Secondary | ICD-10-CM | POA: Diagnosis not present

## 2018-10-07 DIAGNOSIS — Z0000 Encounter for general adult medical examination without abnormal findings: Secondary | ICD-10-CM | POA: Diagnosis not present

## 2018-10-07 DIAGNOSIS — Z1331 Encounter for screening for depression: Secondary | ICD-10-CM | POA: Diagnosis not present

## 2018-10-07 DIAGNOSIS — R5383 Other fatigue: Secondary | ICD-10-CM | POA: Diagnosis not present

## 2018-10-07 DIAGNOSIS — Z79899 Other long term (current) drug therapy: Secondary | ICD-10-CM | POA: Diagnosis not present

## 2018-10-07 DIAGNOSIS — Z23 Encounter for immunization: Secondary | ICD-10-CM | POA: Diagnosis not present

## 2018-10-11 DIAGNOSIS — E663 Overweight: Secondary | ICD-10-CM | POA: Diagnosis not present

## 2018-10-11 DIAGNOSIS — Z720 Tobacco use: Secondary | ICD-10-CM | POA: Diagnosis not present

## 2018-10-11 DIAGNOSIS — M170 Bilateral primary osteoarthritis of knee: Secondary | ICD-10-CM | POA: Diagnosis not present

## 2018-10-11 DIAGNOSIS — K7460 Unspecified cirrhosis of liver: Secondary | ICD-10-CM | POA: Diagnosis not present

## 2018-10-11 DIAGNOSIS — M5136 Other intervertebral disc degeneration, lumbar region: Secondary | ICD-10-CM | POA: Diagnosis not present

## 2018-10-11 DIAGNOSIS — M159 Polyosteoarthritis, unspecified: Secondary | ICD-10-CM | POA: Diagnosis not present

## 2018-10-11 DIAGNOSIS — G894 Chronic pain syndrome: Secondary | ICD-10-CM | POA: Diagnosis not present

## 2018-10-11 DIAGNOSIS — J449 Chronic obstructive pulmonary disease, unspecified: Secondary | ICD-10-CM | POA: Diagnosis not present

## 2018-12-06 DIAGNOSIS — Z6838 Body mass index (BMI) 38.0-38.9, adult: Secondary | ICD-10-CM | POA: Diagnosis not present

## 2018-12-06 DIAGNOSIS — M1990 Unspecified osteoarthritis, unspecified site: Secondary | ICD-10-CM | POA: Diagnosis not present

## 2018-12-06 DIAGNOSIS — K7030 Alcoholic cirrhosis of liver without ascites: Secondary | ICD-10-CM | POA: Diagnosis not present

## 2018-12-30 DIAGNOSIS — D649 Anemia, unspecified: Secondary | ICD-10-CM | POA: Diagnosis not present

## 2019-01-05 DIAGNOSIS — K7030 Alcoholic cirrhosis of liver without ascites: Secondary | ICD-10-CM | POA: Diagnosis not present

## 2019-01-05 DIAGNOSIS — D649 Anemia, unspecified: Secondary | ICD-10-CM | POA: Diagnosis not present

## 2019-01-05 DIAGNOSIS — K7689 Other specified diseases of liver: Secondary | ICD-10-CM | POA: Diagnosis not present

## 2019-01-25 ENCOUNTER — Ambulatory Visit: Payer: Medicare HMO | Admitting: Cardiology

## 2019-02-15 ENCOUNTER — Ambulatory Visit: Payer: Medicare HMO | Admitting: Cardiology

## 2019-03-10 DIAGNOSIS — Z23 Encounter for immunization: Secondary | ICD-10-CM | POA: Diagnosis not present

## 2019-04-20 DIAGNOSIS — M549 Dorsalgia, unspecified: Secondary | ICD-10-CM | POA: Diagnosis not present

## 2019-04-20 DIAGNOSIS — G8929 Other chronic pain: Secondary | ICD-10-CM | POA: Diagnosis not present

## 2019-04-20 DIAGNOSIS — Z6837 Body mass index (BMI) 37.0-37.9, adult: Secondary | ICD-10-CM | POA: Diagnosis not present

## 2019-04-20 DIAGNOSIS — D509 Iron deficiency anemia, unspecified: Secondary | ICD-10-CM | POA: Diagnosis not present

## 2019-04-20 DIAGNOSIS — D638 Anemia in other chronic diseases classified elsewhere: Secondary | ICD-10-CM | POA: Diagnosis not present

## 2019-04-20 DIAGNOSIS — M1990 Unspecified osteoarthritis, unspecified site: Secondary | ICD-10-CM | POA: Diagnosis not present

## 2019-04-20 DIAGNOSIS — D649 Anemia, unspecified: Secondary | ICD-10-CM | POA: Diagnosis not present

## 2019-04-28 ENCOUNTER — Other Ambulatory Visit: Payer: Self-pay | Admitting: Family Medicine

## 2019-04-28 DIAGNOSIS — G8929 Other chronic pain: Secondary | ICD-10-CM

## 2019-05-16 ENCOUNTER — Ambulatory Visit
Admission: RE | Admit: 2019-05-16 | Discharge: 2019-05-16 | Disposition: A | Discharge status: Home / Self Care | Payer: Medicare HMO | Source: Ambulatory Visit | Attending: Family Medicine | Admitting: Family Medicine

## 2019-05-16 DIAGNOSIS — M48061 Spinal stenosis, lumbar region without neurogenic claudication: Secondary | ICD-10-CM | POA: Diagnosis not present

## 2019-05-16 DIAGNOSIS — G8929 Other chronic pain: Secondary | ICD-10-CM

## 2019-05-16 DIAGNOSIS — M549 Dorsalgia, unspecified: Secondary | ICD-10-CM

## 2019-05-17 DIAGNOSIS — M21372 Foot drop, left foot: Secondary | ICD-10-CM | POA: Diagnosis not present

## 2019-05-17 DIAGNOSIS — M4802 Spinal stenosis, cervical region: Secondary | ICD-10-CM | POA: Diagnosis not present

## 2019-05-17 DIAGNOSIS — M48062 Spinal stenosis, lumbar region with neurogenic claudication: Secondary | ICD-10-CM | POA: Diagnosis not present

## 2019-05-17 DIAGNOSIS — M21371 Foot drop, right foot: Secondary | ICD-10-CM | POA: Diagnosis not present

## 2019-05-17 DIAGNOSIS — R292 Abnormal reflex: Secondary | ICD-10-CM | POA: Diagnosis not present

## 2019-06-03 ENCOUNTER — Other Ambulatory Visit: Payer: Self-pay | Admitting: Neurosurgery

## 2019-06-03 DIAGNOSIS — R292 Abnormal reflex: Secondary | ICD-10-CM

## 2019-06-03 DIAGNOSIS — M4802 Spinal stenosis, cervical region: Secondary | ICD-10-CM

## 2019-06-25 ENCOUNTER — Ambulatory Visit
Admission: RE | Admit: 2019-06-25 | Discharge: 2019-06-25 | Disposition: A | Discharge status: Home / Self Care | Payer: Medicare HMO | Source: Ambulatory Visit | Attending: Neurosurgery | Admitting: Neurosurgery

## 2019-06-25 ENCOUNTER — Other Ambulatory Visit: Payer: Self-pay

## 2019-06-25 DIAGNOSIS — M2578 Osteophyte, vertebrae: Secondary | ICD-10-CM | POA: Diagnosis not present

## 2019-06-25 DIAGNOSIS — R292 Abnormal reflex: Secondary | ICD-10-CM

## 2019-06-25 DIAGNOSIS — M4802 Spinal stenosis, cervical region: Secondary | ICD-10-CM | POA: Diagnosis not present

## 2019-06-25 DIAGNOSIS — M5134 Other intervertebral disc degeneration, thoracic region: Secondary | ICD-10-CM | POA: Diagnosis not present

## 2019-07-01 DIAGNOSIS — I10 Essential (primary) hypertension: Secondary | ICD-10-CM | POA: Diagnosis not present

## 2019-07-01 DIAGNOSIS — M4714 Other spondylosis with myelopathy, thoracic region: Secondary | ICD-10-CM | POA: Diagnosis not present

## 2019-07-01 DIAGNOSIS — Z6837 Body mass index (BMI) 37.0-37.9, adult: Secondary | ICD-10-CM | POA: Diagnosis not present

## 2019-07-01 DIAGNOSIS — M48062 Spinal stenosis, lumbar region with neurogenic claudication: Secondary | ICD-10-CM | POA: Diagnosis not present

## 2019-07-04 ENCOUNTER — Other Ambulatory Visit: Payer: Self-pay | Admitting: Cardiology

## 2019-07-16 DIAGNOSIS — F17200 Nicotine dependence, unspecified, uncomplicated: Secondary | ICD-10-CM | POA: Diagnosis not present

## 2019-07-16 DIAGNOSIS — B951 Streptococcus, group B, as the cause of diseases classified elsewhere: Secondary | ICD-10-CM | POA: Diagnosis not present

## 2019-07-16 DIAGNOSIS — K7030 Alcoholic cirrhosis of liver without ascites: Secondary | ICD-10-CM | POA: Diagnosis not present

## 2019-07-16 DIAGNOSIS — I4891 Unspecified atrial fibrillation: Secondary | ICD-10-CM | POA: Diagnosis not present

## 2019-07-16 DIAGNOSIS — I8500 Esophageal varices without bleeding: Secondary | ICD-10-CM | POA: Diagnosis not present

## 2019-07-16 DIAGNOSIS — G9589 Other specified diseases of spinal cord: Secondary | ICD-10-CM | POA: Diagnosis not present

## 2019-07-16 DIAGNOSIS — I700 Atherosclerosis of aorta: Secondary | ICD-10-CM | POA: Diagnosis not present

## 2019-07-16 DIAGNOSIS — M5137 Other intervertebral disc degeneration, lumbosacral region: Secondary | ICD-10-CM | POA: Diagnosis not present

## 2019-07-16 DIAGNOSIS — G629 Polyneuropathy, unspecified: Secondary | ICD-10-CM | POA: Diagnosis not present

## 2019-07-16 DIAGNOSIS — G8929 Other chronic pain: Secondary | ICD-10-CM | POA: Diagnosis not present

## 2019-07-16 DIAGNOSIS — R52 Pain, unspecified: Secondary | ICD-10-CM | POA: Diagnosis not present

## 2019-07-16 DIAGNOSIS — K3189 Other diseases of stomach and duodenum: Secondary | ICD-10-CM | POA: Diagnosis not present

## 2019-07-16 DIAGNOSIS — I358 Other nonrheumatic aortic valve disorders: Secondary | ICD-10-CM | POA: Diagnosis not present

## 2019-07-16 DIAGNOSIS — M5105 Intervertebral disc disorders with myelopathy, thoracolumbar region: Secondary | ICD-10-CM | POA: Diagnosis not present

## 2019-07-16 DIAGNOSIS — D696 Thrombocytopenia, unspecified: Secondary | ICD-10-CM | POA: Diagnosis not present

## 2019-07-16 DIAGNOSIS — R404 Transient alteration of awareness: Secondary | ICD-10-CM | POA: Diagnosis not present

## 2019-07-16 DIAGNOSIS — N281 Cyst of kidney, acquired: Secondary | ICD-10-CM | POA: Diagnosis not present

## 2019-07-16 DIAGNOSIS — M47812 Spondylosis without myelopathy or radiculopathy, cervical region: Secondary | ICD-10-CM | POA: Diagnosis not present

## 2019-07-16 DIAGNOSIS — R6520 Severe sepsis without septic shock: Secondary | ICD-10-CM | POA: Diagnosis not present

## 2019-07-16 DIAGNOSIS — K766 Portal hypertension: Secondary | ICD-10-CM | POA: Diagnosis not present

## 2019-07-16 DIAGNOSIS — M5125 Other intervertebral disc displacement, thoracolumbar region: Secondary | ICD-10-CM | POA: Diagnosis not present

## 2019-07-16 DIAGNOSIS — Z13810 Encounter for screening for upper gastrointestinal disorder: Secondary | ICD-10-CM | POA: Diagnosis not present

## 2019-07-16 DIAGNOSIS — Z720 Tobacco use: Secondary | ICD-10-CM | POA: Diagnosis not present

## 2019-07-16 DIAGNOSIS — J90 Pleural effusion, not elsewhere classified: Secondary | ICD-10-CM | POA: Diagnosis not present

## 2019-07-16 DIAGNOSIS — M4805 Spinal stenosis, thoracolumbar region: Secondary | ICD-10-CM | POA: Diagnosis not present

## 2019-07-16 DIAGNOSIS — M47816 Spondylosis without myelopathy or radiculopathy, lumbar region: Secondary | ICD-10-CM | POA: Diagnosis not present

## 2019-07-16 DIAGNOSIS — R0689 Other abnormalities of breathing: Secondary | ICD-10-CM | POA: Diagnosis not present

## 2019-07-16 DIAGNOSIS — B199 Unspecified viral hepatitis without hepatic coma: Secondary | ICD-10-CM | POA: Diagnosis not present

## 2019-07-16 DIAGNOSIS — I351 Nonrheumatic aortic (valve) insufficiency: Secondary | ICD-10-CM | POA: Diagnosis not present

## 2019-07-16 DIAGNOSIS — I110 Hypertensive heart disease with heart failure: Secondary | ICD-10-CM | POA: Diagnosis not present

## 2019-07-16 DIAGNOSIS — A491 Streptococcal infection, unspecified site: Secondary | ICD-10-CM | POA: Diagnosis not present

## 2019-07-16 DIAGNOSIS — A4101 Sepsis due to Methicillin susceptible Staphylococcus aureus: Secondary | ICD-10-CM | POA: Diagnosis not present

## 2019-07-16 DIAGNOSIS — I5032 Chronic diastolic (congestive) heart failure: Secondary | ICD-10-CM | POA: Diagnosis not present

## 2019-07-16 DIAGNOSIS — R0989 Other specified symptoms and signs involving the circulatory and respiratory systems: Secondary | ICD-10-CM | POA: Diagnosis not present

## 2019-07-16 DIAGNOSIS — L03311 Cellulitis of abdominal wall: Secondary | ICD-10-CM | POA: Diagnosis not present

## 2019-07-16 DIAGNOSIS — G8918 Other acute postprocedural pain: Secondary | ICD-10-CM | POA: Diagnosis not present

## 2019-07-16 DIAGNOSIS — R29898 Other symptoms and signs involving the musculoskeletal system: Secondary | ICD-10-CM | POA: Diagnosis not present

## 2019-07-16 DIAGNOSIS — F1011 Alcohol abuse, in remission: Secondary | ICD-10-CM | POA: Diagnosis not present

## 2019-07-16 DIAGNOSIS — R197 Diarrhea, unspecified: Secondary | ICD-10-CM | POA: Diagnosis not present

## 2019-07-16 DIAGNOSIS — N179 Acute kidney failure, unspecified: Secondary | ICD-10-CM | POA: Diagnosis not present

## 2019-07-16 DIAGNOSIS — K529 Noninfective gastroenteritis and colitis, unspecified: Secondary | ICD-10-CM | POA: Diagnosis not present

## 2019-07-16 DIAGNOSIS — M4804 Spinal stenosis, thoracic region: Secondary | ICD-10-CM | POA: Diagnosis not present

## 2019-07-16 DIAGNOSIS — E785 Hyperlipidemia, unspecified: Secondary | ICD-10-CM | POA: Diagnosis not present

## 2019-07-16 DIAGNOSIS — R410 Disorientation, unspecified: Secondary | ICD-10-CM | POA: Diagnosis not present

## 2019-07-16 DIAGNOSIS — G9341 Metabolic encephalopathy: Secondary | ICD-10-CM | POA: Diagnosis not present

## 2019-07-16 DIAGNOSIS — I340 Nonrheumatic mitral (valve) insufficiency: Secondary | ICD-10-CM | POA: Diagnosis not present

## 2019-07-16 DIAGNOSIS — R7881 Bacteremia: Secondary | ICD-10-CM | POA: Diagnosis not present

## 2019-07-16 DIAGNOSIS — I509 Heart failure, unspecified: Secondary | ICD-10-CM | POA: Diagnosis not present

## 2019-07-16 DIAGNOSIS — R279 Unspecified lack of coordination: Secondary | ICD-10-CM | POA: Diagnosis not present

## 2019-07-16 DIAGNOSIS — I2510 Atherosclerotic heart disease of native coronary artery without angina pectoris: Secondary | ICD-10-CM | POA: Diagnosis not present

## 2019-07-16 DIAGNOSIS — M5106 Intervertebral disc disorders with myelopathy, lumbar region: Secondary | ICD-10-CM | POA: Diagnosis not present

## 2019-07-16 DIAGNOSIS — M5124 Other intervertebral disc displacement, thoracic region: Secondary | ICD-10-CM | POA: Diagnosis not present

## 2019-07-16 DIAGNOSIS — I517 Cardiomegaly: Secondary | ICD-10-CM | POA: Diagnosis not present

## 2019-07-16 DIAGNOSIS — R5381 Other malaise: Secondary | ICD-10-CM | POA: Diagnosis not present

## 2019-07-16 DIAGNOSIS — I8510 Secondary esophageal varices without bleeding: Secondary | ICD-10-CM | POA: Diagnosis not present

## 2019-07-16 DIAGNOSIS — M545 Low back pain: Secondary | ICD-10-CM | POA: Diagnosis not present

## 2019-07-16 DIAGNOSIS — Q6102 Congenital multiple renal cysts: Secondary | ICD-10-CM | POA: Diagnosis not present

## 2019-07-16 DIAGNOSIS — J984 Other disorders of lung: Secondary | ICD-10-CM | POA: Diagnosis not present

## 2019-07-16 DIAGNOSIS — M4808 Spinal stenosis, sacral and sacrococcygeal region: Secondary | ICD-10-CM | POA: Diagnosis not present

## 2019-07-16 DIAGNOSIS — R4182 Altered mental status, unspecified: Secondary | ICD-10-CM | POA: Diagnosis not present

## 2019-07-16 DIAGNOSIS — I491 Atrial premature depolarization: Secondary | ICD-10-CM | POA: Diagnosis not present

## 2019-07-16 DIAGNOSIS — Z48811 Encounter for surgical aftercare following surgery on the nervous system: Secondary | ICD-10-CM | POA: Diagnosis not present

## 2019-07-16 DIAGNOSIS — N200 Calculus of kidney: Secondary | ICD-10-CM | POA: Diagnosis not present

## 2019-07-16 DIAGNOSIS — M48061 Spinal stenosis, lumbar region without neurogenic claudication: Secondary | ICD-10-CM | POA: Diagnosis not present

## 2019-07-16 DIAGNOSIS — I4819 Other persistent atrial fibrillation: Secondary | ICD-10-CM | POA: Diagnosis not present

## 2019-07-16 DIAGNOSIS — I10 Essential (primary) hypertension: Secondary | ICD-10-CM | POA: Diagnosis not present

## 2019-07-16 DIAGNOSIS — M5127 Other intervertebral disc displacement, lumbosacral region: Secondary | ICD-10-CM | POA: Diagnosis not present

## 2019-07-16 DIAGNOSIS — M47814 Spondylosis without myelopathy or radiculopathy, thoracic region: Secondary | ICD-10-CM | POA: Diagnosis not present

## 2019-07-16 DIAGNOSIS — Z8719 Personal history of other diseases of the digestive system: Secondary | ICD-10-CM | POA: Diagnosis not present

## 2019-07-16 DIAGNOSIS — M4807 Spinal stenosis, lumbosacral region: Secondary | ICD-10-CM | POA: Diagnosis not present

## 2019-07-16 DIAGNOSIS — G9340 Encephalopathy, unspecified: Secondary | ICD-10-CM | POA: Diagnosis not present

## 2019-07-16 DIAGNOSIS — I5030 Unspecified diastolic (congestive) heart failure: Secondary | ICD-10-CM | POA: Diagnosis not present

## 2019-07-16 DIAGNOSIS — M5186 Other intervertebral disc disorders, lumbar region: Secondary | ICD-10-CM | POA: Diagnosis not present

## 2019-07-16 DIAGNOSIS — K219 Gastro-esophageal reflux disease without esophagitis: Secondary | ICD-10-CM | POA: Diagnosis not present

## 2019-07-16 DIAGNOSIS — A419 Sepsis, unspecified organism: Secondary | ICD-10-CM | POA: Diagnosis not present

## 2019-07-16 DIAGNOSIS — K7460 Unspecified cirrhosis of liver: Secondary | ICD-10-CM | POA: Diagnosis not present

## 2019-07-16 DIAGNOSIS — F17210 Nicotine dependence, cigarettes, uncomplicated: Secondary | ICD-10-CM | POA: Diagnosis not present

## 2019-07-16 DIAGNOSIS — A401 Sepsis due to streptococcus, group B: Secondary | ICD-10-CM | POA: Diagnosis not present

## 2019-07-16 DIAGNOSIS — Z743 Need for continuous supervision: Secondary | ICD-10-CM | POA: Diagnosis not present

## 2019-07-16 DIAGNOSIS — D689 Coagulation defect, unspecified: Secondary | ICD-10-CM | POA: Diagnosis not present

## 2019-07-16 DIAGNOSIS — Z20828 Contact with and (suspected) exposure to other viral communicable diseases: Secondary | ICD-10-CM | POA: Diagnosis not present

## 2019-08-04 DIAGNOSIS — X58XXXA Exposure to other specified factors, initial encounter: Secondary | ICD-10-CM | POA: Diagnosis not present

## 2019-08-04 DIAGNOSIS — Z743 Need for continuous supervision: Secondary | ICD-10-CM | POA: Diagnosis not present

## 2019-08-04 DIAGNOSIS — A419 Sepsis, unspecified organism: Secondary | ICD-10-CM | POA: Diagnosis not present

## 2019-08-04 DIAGNOSIS — Z9889 Other specified postprocedural states: Secondary | ICD-10-CM | POA: Diagnosis not present

## 2019-08-04 DIAGNOSIS — I5032 Chronic diastolic (congestive) heart failure: Secondary | ICD-10-CM | POA: Diagnosis not present

## 2019-08-04 DIAGNOSIS — Y999 Unspecified external cause status: Secondary | ICD-10-CM | POA: Diagnosis not present

## 2019-08-04 DIAGNOSIS — I5030 Unspecified diastolic (congestive) heart failure: Secondary | ICD-10-CM | POA: Diagnosis not present

## 2019-08-04 DIAGNOSIS — R5381 Other malaise: Secondary | ICD-10-CM | POA: Diagnosis not present

## 2019-08-04 DIAGNOSIS — Z20828 Contact with and (suspected) exposure to other viral communicable diseases: Secondary | ICD-10-CM | POA: Diagnosis not present

## 2019-08-04 DIAGNOSIS — Z7401 Bed confinement status: Secondary | ICD-10-CM | POA: Diagnosis not present

## 2019-08-04 DIAGNOSIS — N319 Neuromuscular dysfunction of bladder, unspecified: Secondary | ICD-10-CM | POA: Diagnosis not present

## 2019-08-04 DIAGNOSIS — G9589 Other specified diseases of spinal cord: Secondary | ICD-10-CM | POA: Diagnosis not present

## 2019-08-04 DIAGNOSIS — L03312 Cellulitis of back [any part except buttock]: Secondary | ICD-10-CM | POA: Diagnosis not present

## 2019-08-04 DIAGNOSIS — G629 Polyneuropathy, unspecified: Secondary | ICD-10-CM | POA: Diagnosis not present

## 2019-08-04 DIAGNOSIS — R531 Weakness: Secondary | ICD-10-CM | POA: Diagnosis not present

## 2019-08-04 DIAGNOSIS — K7030 Alcoholic cirrhosis of liver without ascites: Secondary | ICD-10-CM | POA: Diagnosis not present

## 2019-08-04 DIAGNOSIS — I10 Essential (primary) hypertension: Secondary | ICD-10-CM | POA: Diagnosis not present

## 2019-08-04 DIAGNOSIS — F329 Major depressive disorder, single episode, unspecified: Secondary | ICD-10-CM | POA: Diagnosis not present

## 2019-08-04 DIAGNOSIS — G8220 Paraplegia, unspecified: Secondary | ICD-10-CM | POA: Diagnosis not present

## 2019-08-04 DIAGNOSIS — T8131XA Disruption of external operation (surgical) wound, not elsewhere classified, initial encounter: Secondary | ICD-10-CM | POA: Diagnosis not present

## 2019-08-04 DIAGNOSIS — G9341 Metabolic encephalopathy: Secondary | ICD-10-CM | POA: Diagnosis not present

## 2019-08-04 DIAGNOSIS — M6281 Muscle weakness (generalized): Secondary | ICD-10-CM | POA: Diagnosis not present

## 2019-08-04 DIAGNOSIS — E861 Hypovolemia: Secondary | ICD-10-CM | POA: Diagnosis not present

## 2019-08-04 DIAGNOSIS — A401 Sepsis due to streptococcus, group B: Secondary | ICD-10-CM | POA: Diagnosis not present

## 2019-08-04 DIAGNOSIS — R6521 Severe sepsis with septic shock: Secondary | ICD-10-CM | POA: Diagnosis not present

## 2019-08-04 DIAGNOSIS — Z09 Encounter for follow-up examination after completed treatment for conditions other than malignant neoplasm: Secondary | ICD-10-CM | POA: Diagnosis not present

## 2019-08-04 DIAGNOSIS — R279 Unspecified lack of coordination: Secondary | ICD-10-CM | POA: Diagnosis not present

## 2019-08-04 DIAGNOSIS — Z1159 Encounter for screening for other viral diseases: Secondary | ICD-10-CM | POA: Diagnosis not present

## 2019-08-04 DIAGNOSIS — R400 Somnolence: Secondary | ICD-10-CM | POA: Diagnosis not present

## 2019-08-04 DIAGNOSIS — M5116 Intervertebral disc disorders with radiculopathy, lumbar region: Secondary | ICD-10-CM | POA: Diagnosis not present

## 2019-08-04 DIAGNOSIS — M5105 Intervertebral disc disorders with myelopathy, thoracolumbar region: Secondary | ICD-10-CM | POA: Diagnosis not present

## 2019-08-04 DIAGNOSIS — L89324 Pressure ulcer of left buttock, stage 4: Secondary | ICD-10-CM | POA: Diagnosis not present

## 2019-08-04 DIAGNOSIS — M4805 Spinal stenosis, thoracolumbar region: Secondary | ICD-10-CM | POA: Diagnosis not present

## 2019-08-04 DIAGNOSIS — R29898 Other symptoms and signs involving the musculoskeletal system: Secondary | ICD-10-CM | POA: Diagnosis not present

## 2019-08-04 DIAGNOSIS — Z720 Tobacco use: Secondary | ICD-10-CM | POA: Diagnosis not present

## 2019-08-04 DIAGNOSIS — M48062 Spinal stenosis, lumbar region with neurogenic claudication: Secondary | ICD-10-CM | POA: Diagnosis not present

## 2019-08-04 DIAGNOSIS — M4808 Spinal stenosis, sacral and sacrococcygeal region: Secondary | ICD-10-CM | POA: Diagnosis not present

## 2019-08-04 DIAGNOSIS — I8510 Secondary esophageal varices without bleeding: Secondary | ICD-10-CM | POA: Diagnosis not present

## 2019-08-04 DIAGNOSIS — T8141XA Infection following a procedure, superficial incisional surgical site, initial encounter: Secondary | ICD-10-CM | POA: Diagnosis not present

## 2019-08-04 DIAGNOSIS — M5137 Other intervertebral disc degeneration, lumbosacral region: Secondary | ICD-10-CM | POA: Diagnosis not present

## 2019-08-04 DIAGNOSIS — K219 Gastro-esophageal reflux disease without esophagitis: Secondary | ICD-10-CM | POA: Diagnosis not present

## 2019-08-04 DIAGNOSIS — L0231 Cutaneous abscess of buttock: Secondary | ICD-10-CM | POA: Diagnosis not present

## 2019-08-04 DIAGNOSIS — M4804 Spinal stenosis, thoracic region: Secondary | ICD-10-CM | POA: Diagnosis not present

## 2019-08-04 DIAGNOSIS — I9589 Other hypotension: Secondary | ICD-10-CM | POA: Diagnosis not present

## 2019-08-04 DIAGNOSIS — Z48811 Encounter for surgical aftercare following surgery on the nervous system: Secondary | ICD-10-CM | POA: Diagnosis not present

## 2019-08-04 DIAGNOSIS — T8130XA Disruption of wound, unspecified, initial encounter: Secondary | ICD-10-CM | POA: Diagnosis not present

## 2019-08-04 DIAGNOSIS — M2550 Pain in unspecified joint: Secondary | ICD-10-CM | POA: Diagnosis not present

## 2019-08-04 DIAGNOSIS — R4182 Altered mental status, unspecified: Secondary | ICD-10-CM | POA: Diagnosis not present

## 2019-08-06 DIAGNOSIS — I5032 Chronic diastolic (congestive) heart failure: Secondary | ICD-10-CM | POA: Diagnosis not present

## 2019-08-06 DIAGNOSIS — I10 Essential (primary) hypertension: Secondary | ICD-10-CM | POA: Diagnosis not present

## 2019-08-06 DIAGNOSIS — F329 Major depressive disorder, single episode, unspecified: Secondary | ICD-10-CM | POA: Diagnosis not present

## 2019-08-06 DIAGNOSIS — N319 Neuromuscular dysfunction of bladder, unspecified: Secondary | ICD-10-CM | POA: Diagnosis not present

## 2019-08-06 DIAGNOSIS — I8510 Secondary esophageal varices without bleeding: Secondary | ICD-10-CM | POA: Diagnosis not present

## 2019-08-06 DIAGNOSIS — R400 Somnolence: Secondary | ICD-10-CM | POA: Diagnosis not present

## 2019-08-06 DIAGNOSIS — M48062 Spinal stenosis, lumbar region with neurogenic claudication: Secondary | ICD-10-CM | POA: Diagnosis not present

## 2019-08-06 DIAGNOSIS — A401 Sepsis due to streptococcus, group B: Secondary | ICD-10-CM | POA: Diagnosis not present

## 2019-08-06 DIAGNOSIS — K7030 Alcoholic cirrhosis of liver without ascites: Secondary | ICD-10-CM | POA: Diagnosis not present

## 2019-08-09 DIAGNOSIS — Z1159 Encounter for screening for other viral diseases: Secondary | ICD-10-CM | POA: Diagnosis not present

## 2019-08-15 DIAGNOSIS — Z1159 Encounter for screening for other viral diseases: Secondary | ICD-10-CM | POA: Diagnosis not present

## 2019-08-19 ENCOUNTER — Ambulatory Visit: Payer: Medicare HMO | Admitting: Cardiology

## 2019-08-22 DIAGNOSIS — I10 Essential (primary) hypertension: Secondary | ICD-10-CM | POA: Diagnosis not present

## 2019-08-22 DIAGNOSIS — R5381 Other malaise: Secondary | ICD-10-CM | POA: Diagnosis not present

## 2019-08-22 DIAGNOSIS — F329 Major depressive disorder, single episode, unspecified: Secondary | ICD-10-CM | POA: Diagnosis not present

## 2019-08-22 DIAGNOSIS — T8141XA Infection following a procedure, superficial incisional surgical site, initial encounter: Secondary | ICD-10-CM | POA: Diagnosis not present

## 2019-08-22 DIAGNOSIS — G9340 Encephalopathy, unspecified: Secondary | ICD-10-CM | POA: Diagnosis not present

## 2019-08-22 DIAGNOSIS — J9811 Atelectasis: Secondary | ICD-10-CM | POA: Diagnosis not present

## 2019-08-22 DIAGNOSIS — B9681 Helicobacter pylori [H. pylori] as the cause of diseases classified elsewhere: Secondary | ICD-10-CM | POA: Diagnosis not present

## 2019-08-22 DIAGNOSIS — L89324 Pressure ulcer of left buttock, stage 4: Secondary | ICD-10-CM | POA: Diagnosis not present

## 2019-08-22 DIAGNOSIS — Y999 Unspecified external cause status: Secondary | ICD-10-CM | POA: Diagnosis not present

## 2019-08-22 DIAGNOSIS — B964 Proteus (mirabilis) (morganii) as the cause of diseases classified elsewhere: Secondary | ICD-10-CM | POA: Diagnosis not present

## 2019-08-22 DIAGNOSIS — T8149XA Infection following a procedure, other surgical site, initial encounter: Secondary | ICD-10-CM | POA: Diagnosis not present

## 2019-08-22 DIAGNOSIS — I5030 Unspecified diastolic (congestive) heart failure: Secondary | ICD-10-CM | POA: Diagnosis not present

## 2019-08-22 DIAGNOSIS — T8130XA Disruption of wound, unspecified, initial encounter: Secondary | ICD-10-CM | POA: Diagnosis not present

## 2019-08-22 DIAGNOSIS — B952 Enterococcus as the cause of diseases classified elsewhere: Secondary | ICD-10-CM | POA: Diagnosis not present

## 2019-08-22 DIAGNOSIS — E872 Acidosis: Secondary | ICD-10-CM | POA: Diagnosis not present

## 2019-08-22 DIAGNOSIS — G9341 Metabolic encephalopathy: Secondary | ICD-10-CM | POA: Diagnosis not present

## 2019-08-22 DIAGNOSIS — G9009 Other idiopathic peripheral autonomic neuropathy: Secondary | ICD-10-CM | POA: Diagnosis not present

## 2019-08-22 DIAGNOSIS — I959 Hypotension, unspecified: Secondary | ICD-10-CM | POA: Diagnosis not present

## 2019-08-22 DIAGNOSIS — T8131XD Disruption of external operation (surgical) wound, not elsewhere classified, subsequent encounter: Secondary | ICD-10-CM | POA: Diagnosis not present

## 2019-08-22 DIAGNOSIS — Z7401 Bed confinement status: Secondary | ICD-10-CM | POA: Diagnosis not present

## 2019-08-22 DIAGNOSIS — E861 Hypovolemia: Secondary | ICD-10-CM | POA: Diagnosis not present

## 2019-08-22 DIAGNOSIS — Z743 Need for continuous supervision: Secondary | ICD-10-CM | POA: Diagnosis not present

## 2019-08-22 DIAGNOSIS — I4581 Long QT syndrome: Secondary | ICD-10-CM | POA: Diagnosis not present

## 2019-08-22 DIAGNOSIS — T8142XA Infection following a procedure, deep incisional surgical site, initial encounter: Secondary | ICD-10-CM | POA: Diagnosis not present

## 2019-08-22 DIAGNOSIS — Z20828 Contact with and (suspected) exposure to other viral communicable diseases: Secondary | ICD-10-CM | POA: Diagnosis not present

## 2019-08-22 DIAGNOSIS — L03312 Cellulitis of back [any part except buttock]: Secondary | ICD-10-CM | POA: Diagnosis not present

## 2019-08-22 DIAGNOSIS — Z09 Encounter for follow-up examination after completed treatment for conditions other than malignant neoplasm: Secondary | ICD-10-CM | POA: Diagnosis not present

## 2019-08-22 DIAGNOSIS — I9589 Other hypotension: Secondary | ICD-10-CM | POA: Diagnosis not present

## 2019-08-22 DIAGNOSIS — Z9889 Other specified postprocedural states: Secondary | ICD-10-CM | POA: Diagnosis not present

## 2019-08-22 DIAGNOSIS — M4628 Osteomyelitis of vertebra, sacral and sacrococcygeal region: Secondary | ICD-10-CM | POA: Diagnosis not present

## 2019-08-22 DIAGNOSIS — Z7901 Long term (current) use of anticoagulants: Secondary | ICD-10-CM | POA: Diagnosis not present

## 2019-08-22 DIAGNOSIS — M6281 Muscle weakness (generalized): Secondary | ICD-10-CM | POA: Diagnosis not present

## 2019-08-22 DIAGNOSIS — B9620 Unspecified Escherichia coli [E. coli] as the cause of diseases classified elsewhere: Secondary | ICD-10-CM | POA: Diagnosis not present

## 2019-08-22 DIAGNOSIS — X58XXXA Exposure to other specified factors, initial encounter: Secondary | ICD-10-CM | POA: Diagnosis not present

## 2019-08-22 DIAGNOSIS — A419 Sepsis, unspecified organism: Secondary | ICD-10-CM | POA: Diagnosis not present

## 2019-08-22 DIAGNOSIS — M2550 Pain in unspecified joint: Secondary | ICD-10-CM | POA: Diagnosis not present

## 2019-08-22 DIAGNOSIS — R6521 Severe sepsis with septic shock: Secondary | ICD-10-CM | POA: Diagnosis not present

## 2019-08-22 DIAGNOSIS — B957 Other staphylococcus as the cause of diseases classified elsewhere: Secondary | ICD-10-CM | POA: Diagnosis not present

## 2019-08-22 DIAGNOSIS — G8220 Paraplegia, unspecified: Secondary | ICD-10-CM | POA: Diagnosis not present

## 2019-08-22 DIAGNOSIS — L89154 Pressure ulcer of sacral region, stage 4: Secondary | ICD-10-CM | POA: Diagnosis not present

## 2019-08-22 DIAGNOSIS — T8131XA Disruption of external operation (surgical) wound, not elsewhere classified, initial encounter: Secondary | ICD-10-CM | POA: Diagnosis not present

## 2019-08-22 DIAGNOSIS — L0231 Cutaneous abscess of buttock: Secondary | ICD-10-CM | POA: Diagnosis not present

## 2019-08-22 DIAGNOSIS — I96 Gangrene, not elsewhere classified: Secondary | ICD-10-CM | POA: Diagnosis not present

## 2019-08-22 DIAGNOSIS — E569 Vitamin deficiency, unspecified: Secondary | ICD-10-CM | POA: Diagnosis not present

## 2019-08-22 DIAGNOSIS — I351 Nonrheumatic aortic (valve) insufficiency: Secondary | ICD-10-CM | POA: Diagnosis not present

## 2019-08-22 DIAGNOSIS — R531 Weakness: Secondary | ICD-10-CM | POA: Diagnosis not present

## 2019-08-22 DIAGNOSIS — N179 Acute kidney failure, unspecified: Secondary | ICD-10-CM | POA: Diagnosis not present

## 2019-08-22 DIAGNOSIS — R279 Unspecified lack of coordination: Secondary | ICD-10-CM | POA: Diagnosis not present

## 2019-08-22 DIAGNOSIS — M5116 Intervertebral disc disorders with radiculopathy, lumbar region: Secondary | ICD-10-CM | POA: Diagnosis not present

## 2019-08-24 DIAGNOSIS — L0231 Cutaneous abscess of buttock: Secondary | ICD-10-CM | POA: Diagnosis not present

## 2019-08-24 DIAGNOSIS — T8130XA Disruption of wound, unspecified, initial encounter: Secondary | ICD-10-CM | POA: Diagnosis not present

## 2019-08-25 ENCOUNTER — Other Ambulatory Visit: Payer: Self-pay

## 2019-08-25 DIAGNOSIS — G9340 Encephalopathy, unspecified: Secondary | ICD-10-CM | POA: Diagnosis not present

## 2019-08-25 DIAGNOSIS — N179 Acute kidney failure, unspecified: Secondary | ICD-10-CM | POA: Diagnosis not present

## 2019-08-25 DIAGNOSIS — I351 Nonrheumatic aortic (valve) insufficiency: Secondary | ICD-10-CM | POA: Diagnosis not present

## 2019-08-25 DIAGNOSIS — I959 Hypotension, unspecified: Secondary | ICD-10-CM | POA: Diagnosis not present

## 2019-08-25 DIAGNOSIS — T8130XA Disruption of wound, unspecified, initial encounter: Secondary | ICD-10-CM | POA: Diagnosis not present

## 2019-08-25 DIAGNOSIS — J9811 Atelectasis: Secondary | ICD-10-CM | POA: Diagnosis not present

## 2019-08-25 DIAGNOSIS — E872 Acidosis: Secondary | ICD-10-CM | POA: Diagnosis not present

## 2019-08-25 DIAGNOSIS — I5030 Unspecified diastolic (congestive) heart failure: Secondary | ICD-10-CM | POA: Diagnosis not present

## 2019-08-25 MED ORDER — LACTULOSE 10 GM/15ML PO SOLN
30.00 | ORAL | Status: DC
Start: 2019-08-25 — End: 2019-08-25

## 2019-08-25 MED ORDER — GENERIC EXTERNAL MEDICATION
Status: DC
Start: 2019-08-25 — End: 2019-08-25

## 2019-08-25 MED ORDER — ACETAMINOPHEN 325 MG PO TABS
650.00 | ORAL_TABLET | ORAL | Status: DC
Start: ? — End: 2019-08-25

## 2019-08-25 MED ORDER — HYDROCODONE-ACETAMINOPHEN 5-325 MG PO TABS
1.00 | ORAL_TABLET | ORAL | Status: DC
Start: ? — End: 2019-08-25

## 2019-08-25 MED ORDER — GENERIC EXTERNAL MEDICATION
500.00 | Status: DC
Start: 2019-08-25 — End: 2019-08-25

## 2019-08-25 MED ORDER — GENERIC EXTERNAL MEDICATION
1.00 | Status: DC
Start: 2019-08-25 — End: 2019-08-25

## 2019-08-25 MED ORDER — GABAPENTIN 300 MG PO CAPS
600.00 | ORAL_CAPSULE | ORAL | Status: DC
Start: 2019-08-25 — End: 2019-08-25

## 2019-08-25 MED ORDER — PLASMA-LYTE A IV SOLN
INTRAVENOUS | Status: DC
Start: ? — End: 2019-08-25

## 2019-08-25 MED ORDER — GENERIC EXTERNAL MEDICATION
100.00 | Status: DC
Start: 2019-08-26 — End: 2019-08-25

## 2019-08-25 MED ORDER — VENLAFAXINE HCL 37.5 MG PO TABS
18.75 | ORAL_TABLET | ORAL | Status: DC
Start: 2019-08-25 — End: 2019-08-25

## 2019-08-25 MED ORDER — PANTOPRAZOLE SODIUM 40 MG PO TBEC
40.00 | DELAYED_RELEASE_TABLET | ORAL | Status: DC
Start: 2019-08-25 — End: 2019-08-25

## 2019-08-25 MED ORDER — ZINC SULFATE 220 (50 ZN) MG PO CAPS
220.00 | ORAL_CAPSULE | ORAL | Status: DC
Start: 2019-08-26 — End: 2019-08-25

## 2019-08-25 MED ORDER — FOLIC ACID 1 MG PO TABS
1.00 | ORAL_TABLET | ORAL | Status: DC
Start: 2019-08-26 — End: 2019-08-25

## 2019-08-25 MED ORDER — QUINTABS PO TABS
1.00 | ORAL_TABLET | ORAL | Status: DC
Start: 2019-08-26 — End: 2019-08-25

## 2019-08-25 MED ORDER — BACLOFEN 10 MG PO TABS
10.00 | ORAL_TABLET | ORAL | Status: DC
Start: 2019-08-25 — End: 2019-08-25

## 2019-08-25 NOTE — Patient Outreach (Signed)
[place] [facility]) Care Management  08/25/2019  Joel Klein Oct 17, 1961 [id number]   Received referral for patient discharge from [facility].  After review of chart patient has been admitted to [facility]  for Wound Dehiscence.  RN CM will close case at this time.    Joel Baseman, RN, MSN [place] Management Care Management Coordinator Direct Line [phone] Cell [phone] Toll Free: [phone]  Fax: [phone]

## 2019-08-26 DIAGNOSIS — I96 Gangrene, not elsewhere classified: Secondary | ICD-10-CM | POA: Diagnosis not present

## 2019-08-26 DIAGNOSIS — M4628 Osteomyelitis of vertebra, sacral and sacrococcygeal region: Secondary | ICD-10-CM | POA: Diagnosis not present

## 2019-08-26 DIAGNOSIS — T8130XA Disruption of wound, unspecified, initial encounter: Secondary | ICD-10-CM | POA: Diagnosis not present

## 2019-08-26 DIAGNOSIS — T8149XA Infection following a procedure, other surgical site, initial encounter: Secondary | ICD-10-CM | POA: Diagnosis not present

## 2019-08-26 DIAGNOSIS — I959 Hypotension, unspecified: Secondary | ICD-10-CM | POA: Diagnosis not present

## 2019-08-26 DIAGNOSIS — B9620 Unspecified Escherichia coli [E. coli] as the cause of diseases classified elsewhere: Secondary | ICD-10-CM | POA: Diagnosis not present

## 2019-08-26 DIAGNOSIS — L89154 Pressure ulcer of sacral region, stage 4: Secondary | ICD-10-CM | POA: Diagnosis not present

## 2019-08-26 DIAGNOSIS — L0231 Cutaneous abscess of buttock: Secondary | ICD-10-CM | POA: Diagnosis not present

## 2019-08-26 DIAGNOSIS — G9340 Encephalopathy, unspecified: Secondary | ICD-10-CM | POA: Diagnosis not present

## 2019-08-28 DIAGNOSIS — L0231 Cutaneous abscess of buttock: Secondary | ICD-10-CM | POA: Diagnosis not present

## 2019-08-28 DIAGNOSIS — B9620 Unspecified Escherichia coli [E. coli] as the cause of diseases classified elsewhere: Secondary | ICD-10-CM | POA: Diagnosis not present

## 2019-08-28 DIAGNOSIS — I96 Gangrene, not elsewhere classified: Secondary | ICD-10-CM | POA: Diagnosis not present

## 2019-08-28 DIAGNOSIS — T8149XA Infection following a procedure, other surgical site, initial encounter: Secondary | ICD-10-CM | POA: Diagnosis not present

## 2019-08-28 DIAGNOSIS — T8130XA Disruption of wound, unspecified, initial encounter: Secondary | ICD-10-CM | POA: Diagnosis not present

## 2019-08-28 DIAGNOSIS — L89154 Pressure ulcer of sacral region, stage 4: Secondary | ICD-10-CM | POA: Diagnosis not present

## 2019-08-28 DIAGNOSIS — M4628 Osteomyelitis of vertebra, sacral and sacrococcygeal region: Secondary | ICD-10-CM | POA: Diagnosis not present

## 2019-08-29 DIAGNOSIS — M4628 Osteomyelitis of vertebra, sacral and sacrococcygeal region: Secondary | ICD-10-CM | POA: Diagnosis not present

## 2019-08-29 DIAGNOSIS — B9620 Unspecified Escherichia coli [E. coli] as the cause of diseases classified elsewhere: Secondary | ICD-10-CM | POA: Diagnosis not present

## 2019-08-29 DIAGNOSIS — B957 Other staphylococcus as the cause of diseases classified elsewhere: Secondary | ICD-10-CM | POA: Diagnosis not present

## 2019-08-29 DIAGNOSIS — B952 Enterococcus as the cause of diseases classified elsewhere: Secondary | ICD-10-CM | POA: Diagnosis not present

## 2019-08-29 DIAGNOSIS — L0231 Cutaneous abscess of buttock: Secondary | ICD-10-CM | POA: Diagnosis not present

## 2019-08-29 DIAGNOSIS — T8149XA Infection following a procedure, other surgical site, initial encounter: Secondary | ICD-10-CM | POA: Diagnosis not present

## 2019-08-29 DIAGNOSIS — L89154 Pressure ulcer of sacral region, stage 4: Secondary | ICD-10-CM | POA: Diagnosis not present

## 2019-08-29 DIAGNOSIS — B964 Proteus (mirabilis) (morganii) as the cause of diseases classified elsewhere: Secondary | ICD-10-CM | POA: Diagnosis not present

## 2019-08-29 DIAGNOSIS — B9681 Helicobacter pylori [H. pylori] as the cause of diseases classified elsewhere: Secondary | ICD-10-CM | POA: Diagnosis not present

## 2019-08-30 DIAGNOSIS — L89154 Pressure ulcer of sacral region, stage 4: Secondary | ICD-10-CM | POA: Diagnosis not present

## 2019-08-30 DIAGNOSIS — B9681 Helicobacter pylori [H. pylori] as the cause of diseases classified elsewhere: Secondary | ICD-10-CM | POA: Diagnosis not present

## 2019-08-30 DIAGNOSIS — B9620 Unspecified Escherichia coli [E. coli] as the cause of diseases classified elsewhere: Secondary | ICD-10-CM | POA: Diagnosis not present

## 2019-08-30 DIAGNOSIS — B952 Enterococcus as the cause of diseases classified elsewhere: Secondary | ICD-10-CM | POA: Diagnosis not present

## 2019-08-30 DIAGNOSIS — L0231 Cutaneous abscess of buttock: Secondary | ICD-10-CM | POA: Diagnosis not present

## 2019-08-30 DIAGNOSIS — B964 Proteus (mirabilis) (morganii) as the cause of diseases classified elsewhere: Secondary | ICD-10-CM | POA: Diagnosis not present

## 2019-08-30 DIAGNOSIS — T8149XA Infection following a procedure, other surgical site, initial encounter: Secondary | ICD-10-CM | POA: Diagnosis not present

## 2019-08-30 DIAGNOSIS — B957 Other staphylococcus as the cause of diseases classified elsewhere: Secondary | ICD-10-CM | POA: Diagnosis not present

## 2019-08-30 DIAGNOSIS — M4628 Osteomyelitis of vertebra, sacral and sacrococcygeal region: Secondary | ICD-10-CM | POA: Diagnosis not present

## 2019-08-31 DIAGNOSIS — L0231 Cutaneous abscess of buttock: Secondary | ICD-10-CM | POA: Diagnosis not present

## 2019-08-31 DIAGNOSIS — L89154 Pressure ulcer of sacral region, stage 4: Secondary | ICD-10-CM | POA: Diagnosis not present

## 2019-08-31 DIAGNOSIS — M4628 Osteomyelitis of vertebra, sacral and sacrococcygeal region: Secondary | ICD-10-CM | POA: Diagnosis not present

## 2019-08-31 DIAGNOSIS — B957 Other staphylococcus as the cause of diseases classified elsewhere: Secondary | ICD-10-CM | POA: Diagnosis not present

## 2019-08-31 DIAGNOSIS — T8149XA Infection following a procedure, other surgical site, initial encounter: Secondary | ICD-10-CM | POA: Diagnosis not present

## 2019-08-31 DIAGNOSIS — B9620 Unspecified Escherichia coli [E. coli] as the cause of diseases classified elsewhere: Secondary | ICD-10-CM | POA: Diagnosis not present

## 2019-08-31 DIAGNOSIS — B952 Enterococcus as the cause of diseases classified elsewhere: Secondary | ICD-10-CM | POA: Diagnosis not present

## 2019-08-31 DIAGNOSIS — B9681 Helicobacter pylori [H. pylori] as the cause of diseases classified elsewhere: Secondary | ICD-10-CM | POA: Diagnosis not present

## 2019-08-31 DIAGNOSIS — B964 Proteus (mirabilis) (morganii) as the cause of diseases classified elsewhere: Secondary | ICD-10-CM | POA: Diagnosis not present

## 2019-09-06 DIAGNOSIS — T8131XD Disruption of external operation (surgical) wound, not elsewhere classified, subsequent encounter: Secondary | ICD-10-CM | POA: Diagnosis not present

## 2019-09-06 DIAGNOSIS — A419 Sepsis, unspecified organism: Secondary | ICD-10-CM | POA: Diagnosis not present

## 2019-09-06 DIAGNOSIS — M48061 Spinal stenosis, lumbar region without neurogenic claudication: Secondary | ICD-10-CM | POA: Diagnosis not present

## 2019-09-06 DIAGNOSIS — Z452 Encounter for adjustment and management of vascular access device: Secondary | ICD-10-CM | POA: Diagnosis not present

## 2019-09-06 DIAGNOSIS — M4628 Osteomyelitis of vertebra, sacral and sacrococcygeal region: Secondary | ICD-10-CM | POA: Diagnosis not present

## 2019-09-06 DIAGNOSIS — G9009 Other idiopathic peripheral autonomic neuropathy: Secondary | ICD-10-CM | POA: Diagnosis not present

## 2019-09-06 DIAGNOSIS — K7460 Unspecified cirrhosis of liver: Secondary | ICD-10-CM | POA: Diagnosis not present

## 2019-09-06 DIAGNOSIS — L89620 Pressure ulcer of left heel, unstageable: Secondary | ICD-10-CM | POA: Diagnosis not present

## 2019-09-06 DIAGNOSIS — T8131XA Disruption of external operation (surgical) wound, not elsewhere classified, initial encounter: Secondary | ICD-10-CM | POA: Diagnosis not present

## 2019-09-06 DIAGNOSIS — L0231 Cutaneous abscess of buttock: Secondary | ICD-10-CM | POA: Diagnosis not present

## 2019-09-06 DIAGNOSIS — B9620 Unspecified Escherichia coli [E. coli] as the cause of diseases classified elsewhere: Secondary | ICD-10-CM | POA: Diagnosis not present

## 2019-09-06 DIAGNOSIS — Z7901 Long term (current) use of anticoagulants: Secondary | ICD-10-CM | POA: Diagnosis not present

## 2019-09-06 DIAGNOSIS — G9340 Encephalopathy, unspecified: Secondary | ICD-10-CM | POA: Diagnosis not present

## 2019-09-06 DIAGNOSIS — I5032 Chronic diastolic (congestive) heart failure: Secondary | ICD-10-CM | POA: Diagnosis not present

## 2019-09-06 DIAGNOSIS — L89329 Pressure ulcer of left buttock, unspecified stage: Secondary | ICD-10-CM | POA: Diagnosis not present

## 2019-09-06 DIAGNOSIS — N179 Acute kidney failure, unspecified: Secondary | ICD-10-CM | POA: Diagnosis not present

## 2019-09-06 DIAGNOSIS — Z03818 Encounter for observation for suspected exposure to other biological agents ruled out: Secondary | ICD-10-CM | POA: Diagnosis not present

## 2019-09-06 DIAGNOSIS — I2510 Atherosclerotic heart disease of native coronary artery without angina pectoris: Secondary | ICD-10-CM | POA: Diagnosis not present

## 2019-09-06 DIAGNOSIS — T8189XA Other complications of procedures, not elsewhere classified, initial encounter: Secondary | ICD-10-CM | POA: Diagnosis not present

## 2019-09-06 DIAGNOSIS — Z87898 Personal history of other specified conditions: Secondary | ICD-10-CM | POA: Diagnosis not present

## 2019-09-06 DIAGNOSIS — T8149XA Infection following a procedure, other surgical site, initial encounter: Secondary | ICD-10-CM | POA: Diagnosis not present

## 2019-09-06 DIAGNOSIS — Z0389 Encounter for observation for other suspected diseases and conditions ruled out: Secondary | ICD-10-CM | POA: Diagnosis not present

## 2019-09-06 DIAGNOSIS — E569 Vitamin deficiency, unspecified: Secondary | ICD-10-CM | POA: Diagnosis not present

## 2019-09-06 DIAGNOSIS — I10 Essential (primary) hypertension: Secondary | ICD-10-CM | POA: Diagnosis not present

## 2019-09-06 DIAGNOSIS — R6520 Severe sepsis without septic shock: Secondary | ICD-10-CM | POA: Diagnosis not present

## 2019-09-06 DIAGNOSIS — L89154 Pressure ulcer of sacral region, stage 4: Secondary | ICD-10-CM | POA: Diagnosis not present

## 2019-09-06 DIAGNOSIS — L89520 Pressure ulcer of left ankle, unstageable: Secondary | ICD-10-CM | POA: Diagnosis not present

## 2019-09-06 DIAGNOSIS — R319 Hematuria, unspecified: Secondary | ICD-10-CM | POA: Diagnosis not present

## 2019-09-06 DIAGNOSIS — I959 Hypotension, unspecified: Secondary | ICD-10-CM | POA: Diagnosis not present

## 2019-09-06 DIAGNOSIS — R5381 Other malaise: Secondary | ICD-10-CM | POA: Diagnosis not present

## 2019-09-06 DIAGNOSIS — F329 Major depressive disorder, single episode, unspecified: Secondary | ICD-10-CM | POA: Diagnosis not present

## 2019-09-06 DIAGNOSIS — Z743 Need for continuous supervision: Secondary | ICD-10-CM | POA: Diagnosis not present

## 2019-09-06 DIAGNOSIS — R279 Unspecified lack of coordination: Secondary | ICD-10-CM | POA: Diagnosis not present

## 2019-09-06 DIAGNOSIS — M6281 Muscle weakness (generalized): Secondary | ICD-10-CM | POA: Diagnosis not present

## 2019-09-06 DIAGNOSIS — M898X8 Other specified disorders of bone, other site: Secondary | ICD-10-CM | POA: Diagnosis not present

## 2019-09-07 DIAGNOSIS — I2510 Atherosclerotic heart disease of native coronary artery without angina pectoris: Secondary | ICD-10-CM | POA: Diagnosis not present

## 2019-09-07 DIAGNOSIS — T8131XA Disruption of external operation (surgical) wound, not elsewhere classified, initial encounter: Secondary | ICD-10-CM | POA: Diagnosis not present

## 2019-09-07 DIAGNOSIS — L89154 Pressure ulcer of sacral region, stage 4: Secondary | ICD-10-CM | POA: Diagnosis not present

## 2019-09-07 DIAGNOSIS — Z87898 Personal history of other specified conditions: Secondary | ICD-10-CM | POA: Diagnosis not present

## 2019-09-07 DIAGNOSIS — M6281 Muscle weakness (generalized): Secondary | ICD-10-CM | POA: Diagnosis not present

## 2019-09-07 DIAGNOSIS — M4628 Osteomyelitis of vertebra, sacral and sacrococcygeal region: Secondary | ICD-10-CM | POA: Diagnosis not present

## 2019-09-07 DIAGNOSIS — M48061 Spinal stenosis, lumbar region without neurogenic claudication: Secondary | ICD-10-CM | POA: Diagnosis not present

## 2019-09-07 DIAGNOSIS — I5032 Chronic diastolic (congestive) heart failure: Secondary | ICD-10-CM | POA: Diagnosis not present

## 2019-09-07 DIAGNOSIS — R5381 Other malaise: Secondary | ICD-10-CM | POA: Diagnosis not present

## 2019-09-07 DIAGNOSIS — K7460 Unspecified cirrhosis of liver: Secondary | ICD-10-CM | POA: Diagnosis not present

## 2019-09-08 DIAGNOSIS — Z87898 Personal history of other specified conditions: Secondary | ICD-10-CM | POA: Diagnosis not present

## 2019-09-08 DIAGNOSIS — I2510 Atherosclerotic heart disease of native coronary artery without angina pectoris: Secondary | ICD-10-CM | POA: Diagnosis not present

## 2019-09-08 DIAGNOSIS — R5381 Other malaise: Secondary | ICD-10-CM | POA: Diagnosis not present

## 2019-09-08 DIAGNOSIS — I5032 Chronic diastolic (congestive) heart failure: Secondary | ICD-10-CM | POA: Diagnosis not present

## 2019-09-08 DIAGNOSIS — L89154 Pressure ulcer of sacral region, stage 4: Secondary | ICD-10-CM | POA: Diagnosis not present

## 2019-09-08 DIAGNOSIS — K7460 Unspecified cirrhosis of liver: Secondary | ICD-10-CM | POA: Diagnosis not present

## 2019-09-08 DIAGNOSIS — M48061 Spinal stenosis, lumbar region without neurogenic claudication: Secondary | ICD-10-CM | POA: Diagnosis not present

## 2019-09-08 DIAGNOSIS — T8131XA Disruption of external operation (surgical) wound, not elsewhere classified, initial encounter: Secondary | ICD-10-CM | POA: Diagnosis not present

## 2019-09-09 DIAGNOSIS — M48061 Spinal stenosis, lumbar region without neurogenic claudication: Secondary | ICD-10-CM | POA: Diagnosis not present

## 2019-09-09 DIAGNOSIS — T8131XA Disruption of external operation (surgical) wound, not elsewhere classified, initial encounter: Secondary | ICD-10-CM | POA: Diagnosis not present

## 2019-09-09 DIAGNOSIS — Z87898 Personal history of other specified conditions: Secondary | ICD-10-CM | POA: Diagnosis not present

## 2019-09-09 DIAGNOSIS — R5381 Other malaise: Secondary | ICD-10-CM | POA: Diagnosis not present

## 2019-09-09 DIAGNOSIS — L89154 Pressure ulcer of sacral region, stage 4: Secondary | ICD-10-CM | POA: Diagnosis not present

## 2019-09-09 DIAGNOSIS — I2510 Atherosclerotic heart disease of native coronary artery without angina pectoris: Secondary | ICD-10-CM | POA: Diagnosis not present

## 2019-09-09 DIAGNOSIS — K7460 Unspecified cirrhosis of liver: Secondary | ICD-10-CM | POA: Diagnosis not present

## 2019-09-09 DIAGNOSIS — I5032 Chronic diastolic (congestive) heart failure: Secondary | ICD-10-CM | POA: Diagnosis not present

## 2019-09-12 DIAGNOSIS — L89620 Pressure ulcer of left heel, unstageable: Secondary | ICD-10-CM | POA: Diagnosis not present

## 2019-09-12 DIAGNOSIS — K7460 Unspecified cirrhosis of liver: Secondary | ICD-10-CM | POA: Diagnosis not present

## 2019-09-12 DIAGNOSIS — Z452 Encounter for adjustment and management of vascular access device: Secondary | ICD-10-CM | POA: Diagnosis not present

## 2019-09-12 DIAGNOSIS — I5032 Chronic diastolic (congestive) heart failure: Secondary | ICD-10-CM | POA: Diagnosis not present

## 2019-09-12 DIAGNOSIS — L89154 Pressure ulcer of sacral region, stage 4: Secondary | ICD-10-CM | POA: Diagnosis not present

## 2019-09-12 DIAGNOSIS — Z87898 Personal history of other specified conditions: Secondary | ICD-10-CM | POA: Diagnosis not present

## 2019-09-12 DIAGNOSIS — I2510 Atherosclerotic heart disease of native coronary artery without angina pectoris: Secondary | ICD-10-CM | POA: Diagnosis not present

## 2019-09-12 DIAGNOSIS — T8131XA Disruption of external operation (surgical) wound, not elsewhere classified, initial encounter: Secondary | ICD-10-CM | POA: Diagnosis not present

## 2019-09-12 DIAGNOSIS — R5381 Other malaise: Secondary | ICD-10-CM | POA: Diagnosis not present

## 2019-09-12 DIAGNOSIS — N179 Acute kidney failure, unspecified: Secondary | ICD-10-CM | POA: Diagnosis not present

## 2019-09-12 DIAGNOSIS — L89520 Pressure ulcer of left ankle, unstageable: Secondary | ICD-10-CM | POA: Diagnosis not present

## 2019-09-12 DIAGNOSIS — T8189XA Other complications of procedures, not elsewhere classified, initial encounter: Secondary | ICD-10-CM | POA: Diagnosis not present

## 2019-09-12 DIAGNOSIS — G9340 Encephalopathy, unspecified: Secondary | ICD-10-CM | POA: Diagnosis not present

## 2019-09-13 DIAGNOSIS — I5032 Chronic diastolic (congestive) heart failure: Secondary | ICD-10-CM | POA: Diagnosis not present

## 2019-09-13 DIAGNOSIS — M898X8 Other specified disorders of bone, other site: Secondary | ICD-10-CM | POA: Diagnosis not present

## 2019-09-13 DIAGNOSIS — A419 Sepsis, unspecified organism: Secondary | ICD-10-CM | POA: Diagnosis not present

## 2019-09-13 DIAGNOSIS — T8131XA Disruption of external operation (surgical) wound, not elsewhere classified, initial encounter: Secondary | ICD-10-CM | POA: Diagnosis not present

## 2019-09-13 DIAGNOSIS — Z0389 Encounter for observation for other suspected diseases and conditions ruled out: Secondary | ICD-10-CM | POA: Diagnosis not present

## 2019-09-13 DIAGNOSIS — T8149XA Infection following a procedure, other surgical site, initial encounter: Secondary | ICD-10-CM | POA: Diagnosis not present

## 2019-09-13 DIAGNOSIS — R6520 Severe sepsis without septic shock: Secondary | ICD-10-CM | POA: Diagnosis not present

## 2019-09-13 DIAGNOSIS — G9340 Encephalopathy, unspecified: Secondary | ICD-10-CM | POA: Diagnosis not present

## 2019-09-13 DIAGNOSIS — R5381 Other malaise: Secondary | ICD-10-CM | POA: Diagnosis not present

## 2019-09-13 DIAGNOSIS — K7460 Unspecified cirrhosis of liver: Secondary | ICD-10-CM | POA: Diagnosis not present

## 2019-09-13 DIAGNOSIS — N179 Acute kidney failure, unspecified: Secondary | ICD-10-CM | POA: Diagnosis not present

## 2019-09-13 DIAGNOSIS — I959 Hypotension, unspecified: Secondary | ICD-10-CM | POA: Diagnosis not present

## 2019-09-13 DIAGNOSIS — I2510 Atherosclerotic heart disease of native coronary artery without angina pectoris: Secondary | ICD-10-CM | POA: Diagnosis not present

## 2019-09-13 DIAGNOSIS — L89329 Pressure ulcer of left buttock, unspecified stage: Secondary | ICD-10-CM | POA: Diagnosis not present

## 2019-09-13 DIAGNOSIS — L89154 Pressure ulcer of sacral region, stage 4: Secondary | ICD-10-CM | POA: Diagnosis not present

## 2019-09-13 DIAGNOSIS — Z87898 Personal history of other specified conditions: Secondary | ICD-10-CM | POA: Diagnosis not present

## 2019-09-14 DIAGNOSIS — M898X8 Other specified disorders of bone, other site: Secondary | ICD-10-CM | POA: Diagnosis not present

## 2019-09-14 DIAGNOSIS — R0902 Hypoxemia: Secondary | ICD-10-CM | POA: Diagnosis not present

## 2019-09-14 DIAGNOSIS — E8779 Other fluid overload: Secondary | ICD-10-CM | POA: Diagnosis not present

## 2019-09-14 DIAGNOSIS — I6782 Cerebral ischemia: Secondary | ICD-10-CM | POA: Diagnosis not present

## 2019-09-14 DIAGNOSIS — T8149XA Infection following a procedure, other surgical site, initial encounter: Secondary | ICD-10-CM | POA: Diagnosis not present

## 2019-09-14 DIAGNOSIS — Z6841 Body Mass Index (BMI) 40.0 and over, adult: Secondary | ICD-10-CM | POA: Diagnosis not present

## 2019-09-14 DIAGNOSIS — M2550 Pain in unspecified joint: Secondary | ICD-10-CM | POA: Diagnosis not present

## 2019-09-14 DIAGNOSIS — N170 Acute kidney failure with tubular necrosis: Secondary | ICD-10-CM | POA: Diagnosis not present

## 2019-09-14 DIAGNOSIS — L89329 Pressure ulcer of left buttock, unspecified stage: Secondary | ICD-10-CM | POA: Diagnosis not present

## 2019-09-14 DIAGNOSIS — K219 Gastro-esophageal reflux disease without esophagitis: Secondary | ICD-10-CM | POA: Diagnosis not present

## 2019-09-14 DIAGNOSIS — K7460 Unspecified cirrhosis of liver: Secondary | ICD-10-CM | POA: Diagnosis not present

## 2019-09-14 DIAGNOSIS — E872 Acidosis: Secondary | ICD-10-CM | POA: Diagnosis not present

## 2019-09-14 DIAGNOSIS — E875 Hyperkalemia: Secondary | ICD-10-CM | POA: Diagnosis not present

## 2019-09-14 DIAGNOSIS — Z9889 Other specified postprocedural states: Secondary | ICD-10-CM | POA: Diagnosis not present

## 2019-09-14 DIAGNOSIS — L89314 Pressure ulcer of right buttock, stage 4: Secondary | ICD-10-CM | POA: Diagnosis not present

## 2019-09-14 DIAGNOSIS — M4646 Discitis, unspecified, lumbar region: Secondary | ICD-10-CM | POA: Diagnosis not present

## 2019-09-14 DIAGNOSIS — R6520 Severe sepsis without septic shock: Secondary | ICD-10-CM | POA: Diagnosis not present

## 2019-09-14 DIAGNOSIS — R001 Bradycardia, unspecified: Secondary | ICD-10-CM | POA: Diagnosis not present

## 2019-09-14 DIAGNOSIS — G061 Intraspinal abscess and granuloma: Secondary | ICD-10-CM | POA: Diagnosis not present

## 2019-09-14 DIAGNOSIS — N390 Urinary tract infection, site not specified: Secondary | ICD-10-CM | POA: Diagnosis not present

## 2019-09-14 DIAGNOSIS — Z0389 Encounter for observation for other suspected diseases and conditions ruled out: Secondary | ICD-10-CM | POA: Diagnosis not present

## 2019-09-14 DIAGNOSIS — M4626 Osteomyelitis of vertebra, lumbar region: Secondary | ICD-10-CM | POA: Diagnosis not present

## 2019-09-14 DIAGNOSIS — M868X8 Other osteomyelitis, other site: Secondary | ICD-10-CM | POA: Diagnosis not present

## 2019-09-14 DIAGNOSIS — K766 Portal hypertension: Secondary | ICD-10-CM | POA: Diagnosis not present

## 2019-09-14 DIAGNOSIS — T8140XA Infection following a procedure, unspecified, initial encounter: Secondary | ICD-10-CM | POA: Diagnosis not present

## 2019-09-14 DIAGNOSIS — N179 Acute kidney failure, unspecified: Secondary | ICD-10-CM | POA: Diagnosis not present

## 2019-09-14 DIAGNOSIS — A1801 Tuberculosis of spine: Secondary | ICD-10-CM | POA: Diagnosis not present

## 2019-09-14 DIAGNOSIS — L89154 Pressure ulcer of sacral region, stage 4: Secondary | ICD-10-CM | POA: Diagnosis not present

## 2019-09-14 DIAGNOSIS — M4628 Osteomyelitis of vertebra, sacral and sacrococcygeal region: Secondary | ICD-10-CM | POA: Diagnosis not present

## 2019-09-14 DIAGNOSIS — L89149 Pressure ulcer of left lower back, unspecified stage: Secondary | ICD-10-CM | POA: Diagnosis not present

## 2019-09-14 DIAGNOSIS — B3749 Other urogenital candidiasis: Secondary | ICD-10-CM | POA: Diagnosis not present

## 2019-09-14 DIAGNOSIS — R531 Weakness: Secondary | ICD-10-CM | POA: Diagnosis not present

## 2019-09-14 DIAGNOSIS — I2510 Atherosclerotic heart disease of native coronary artery without angina pectoris: Secondary | ICD-10-CM | POA: Diagnosis not present

## 2019-09-14 DIAGNOSIS — Z7401 Bed confinement status: Secondary | ICD-10-CM | POA: Diagnosis not present

## 2019-09-14 DIAGNOSIS — G9341 Metabolic encephalopathy: Secondary | ICD-10-CM | POA: Diagnosis not present

## 2019-09-14 DIAGNOSIS — I5032 Chronic diastolic (congestive) heart failure: Secondary | ICD-10-CM | POA: Diagnosis not present

## 2019-09-14 DIAGNOSIS — R4182 Altered mental status, unspecified: Secondary | ICD-10-CM | POA: Diagnosis not present

## 2019-09-14 DIAGNOSIS — I959 Hypotension, unspecified: Secondary | ICD-10-CM | POA: Diagnosis not present

## 2019-09-14 DIAGNOSIS — F17210 Nicotine dependence, cigarettes, uncomplicated: Secondary | ICD-10-CM | POA: Diagnosis not present

## 2019-09-14 DIAGNOSIS — A419 Sepsis, unspecified organism: Secondary | ICD-10-CM | POA: Diagnosis not present

## 2019-09-16 DIAGNOSIS — G9341 Metabolic encephalopathy: Secondary | ICD-10-CM | POA: Diagnosis not present

## 2019-09-16 DIAGNOSIS — G459 Transient cerebral ischemic attack, unspecified: Secondary | ICD-10-CM | POA: Diagnosis not present

## 2019-09-16 DIAGNOSIS — G062 Extradural and subdural abscess, unspecified: Secondary | ICD-10-CM | POA: Diagnosis not present

## 2019-09-16 DIAGNOSIS — B9620 Unspecified Escherichia coli [E. coli] as the cause of diseases classified elsewhere: Secondary | ICD-10-CM | POA: Diagnosis not present

## 2019-09-16 DIAGNOSIS — D7212 Drug rash with eosinophilia and systemic symptoms syndrome: Secondary | ICD-10-CM | POA: Diagnosis not present

## 2019-09-16 DIAGNOSIS — E8770 Fluid overload, unspecified: Secondary | ICD-10-CM | POA: Diagnosis not present

## 2019-09-16 DIAGNOSIS — B961 Klebsiella pneumoniae [K. pneumoniae] as the cause of diseases classified elsewhere: Secondary | ICD-10-CM | POA: Diagnosis not present

## 2019-09-16 DIAGNOSIS — R34 Anuria and oliguria: Secondary | ICD-10-CM | POA: Diagnosis not present

## 2019-09-16 DIAGNOSIS — B952 Enterococcus as the cause of diseases classified elsewhere: Secondary | ICD-10-CM | POA: Diagnosis not present

## 2019-09-16 DIAGNOSIS — Q231 Congenital insufficiency of aortic valve: Secondary | ICD-10-CM | POA: Diagnosis not present

## 2019-09-16 DIAGNOSIS — I959 Hypotension, unspecified: Secondary | ICD-10-CM | POA: Diagnosis not present

## 2019-09-16 DIAGNOSIS — G9009 Other idiopathic peripheral autonomic neuropathy: Secondary | ICD-10-CM | POA: Diagnosis not present

## 2019-09-16 DIAGNOSIS — L89520 Pressure ulcer of left ankle, unstageable: Secondary | ICD-10-CM | POA: Diagnosis not present

## 2019-09-16 DIAGNOSIS — M4626 Osteomyelitis of vertebra, lumbar region: Secondary | ICD-10-CM | POA: Diagnosis not present

## 2019-09-16 DIAGNOSIS — I351 Nonrheumatic aortic (valve) insufficiency: Secondary | ICD-10-CM | POA: Diagnosis not present

## 2019-09-16 DIAGNOSIS — I119 Hypertensive heart disease without heart failure: Secondary | ICD-10-CM | POA: Diagnosis not present

## 2019-09-16 DIAGNOSIS — Z7901 Long term (current) use of anticoagulants: Secondary | ICD-10-CM | POA: Diagnosis not present

## 2019-09-16 DIAGNOSIS — A499 Bacterial infection, unspecified: Secondary | ICD-10-CM | POA: Diagnosis not present

## 2019-09-16 DIAGNOSIS — J8289 Other pulmonary eosinophilia, not elsewhere classified: Secondary | ICD-10-CM | POA: Diagnosis not present

## 2019-09-16 DIAGNOSIS — L0231 Cutaneous abscess of buttock: Secondary | ICD-10-CM | POA: Diagnosis not present

## 2019-09-16 DIAGNOSIS — K7030 Alcoholic cirrhosis of liver without ascites: Secondary | ICD-10-CM | POA: Diagnosis not present

## 2019-09-16 DIAGNOSIS — I2510 Atherosclerotic heart disease of native coronary artery without angina pectoris: Secondary | ICD-10-CM | POA: Diagnosis not present

## 2019-09-16 DIAGNOSIS — R627 Adult failure to thrive: Secondary | ICD-10-CM | POA: Diagnosis not present

## 2019-09-16 DIAGNOSIS — Z743 Need for continuous supervision: Secondary | ICD-10-CM | POA: Diagnosis not present

## 2019-09-16 DIAGNOSIS — M868X8 Other osteomyelitis, other site: Secondary | ICD-10-CM | POA: Diagnosis not present

## 2019-09-16 DIAGNOSIS — A4189 Other specified sepsis: Secondary | ICD-10-CM | POA: Diagnosis not present

## 2019-09-16 DIAGNOSIS — I10 Essential (primary) hypertension: Secondary | ICD-10-CM | POA: Diagnosis not present

## 2019-09-16 DIAGNOSIS — N179 Acute kidney failure, unspecified: Secondary | ICD-10-CM | POA: Diagnosis not present

## 2019-09-16 DIAGNOSIS — M8628 Subacute osteomyelitis, other site: Secondary | ICD-10-CM | POA: Diagnosis not present

## 2019-09-16 DIAGNOSIS — M545 Low back pain: Secondary | ICD-10-CM | POA: Diagnosis not present

## 2019-09-16 DIAGNOSIS — D721 Eosinophilia, unspecified: Secondary | ICD-10-CM | POA: Diagnosis not present

## 2019-09-16 DIAGNOSIS — S90822A Blister (nonthermal), left foot, initial encounter: Secondary | ICD-10-CM | POA: Diagnosis not present

## 2019-09-16 DIAGNOSIS — I5030 Unspecified diastolic (congestive) heart failure: Secondary | ICD-10-CM | POA: Diagnosis not present

## 2019-09-16 DIAGNOSIS — L89314 Pressure ulcer of right buttock, stage 4: Secondary | ICD-10-CM | POA: Diagnosis not present

## 2019-09-16 DIAGNOSIS — E872 Acidosis: Secondary | ICD-10-CM | POA: Diagnosis not present

## 2019-09-16 DIAGNOSIS — I5032 Chronic diastolic (congestive) heart failure: Secondary | ICD-10-CM | POA: Diagnosis not present

## 2019-09-16 DIAGNOSIS — R531 Weakness: Secondary | ICD-10-CM | POA: Diagnosis not present

## 2019-09-16 DIAGNOSIS — K766 Portal hypertension: Secondary | ICD-10-CM | POA: Diagnosis not present

## 2019-09-16 DIAGNOSIS — B9689 Other specified bacterial agents as the cause of diseases classified elsewhere: Secondary | ICD-10-CM | POA: Diagnosis not present

## 2019-09-16 DIAGNOSIS — A1801 Tuberculosis of spine: Secondary | ICD-10-CM | POA: Diagnosis not present

## 2019-09-16 DIAGNOSIS — E8809 Other disorders of plasma-protein metabolism, not elsewhere classified: Secondary | ICD-10-CM | POA: Diagnosis not present

## 2019-09-16 DIAGNOSIS — I358 Other nonrheumatic aortic valve disorders: Secondary | ICD-10-CM | POA: Diagnosis not present

## 2019-09-16 DIAGNOSIS — N170 Acute kidney failure with tubular necrosis: Secondary | ICD-10-CM | POA: Diagnosis not present

## 2019-09-16 DIAGNOSIS — M4628 Osteomyelitis of vertebra, sacral and sacrococcygeal region: Secondary | ICD-10-CM | POA: Diagnosis not present

## 2019-09-16 DIAGNOSIS — B964 Proteus (mirabilis) (morganii) as the cause of diseases classified elsewhere: Secondary | ICD-10-CM | POA: Diagnosis not present

## 2019-09-16 DIAGNOSIS — G253 Myoclonus: Secondary | ICD-10-CM | POA: Diagnosis not present

## 2019-09-16 DIAGNOSIS — R4182 Altered mental status, unspecified: Secondary | ICD-10-CM | POA: Diagnosis not present

## 2019-09-16 DIAGNOSIS — U071 COVID-19: Secondary | ICD-10-CM | POA: Diagnosis not present

## 2019-09-16 DIAGNOSIS — I8510 Secondary esophageal varices without bleeding: Secondary | ICD-10-CM | POA: Diagnosis not present

## 2019-09-16 DIAGNOSIS — R9431 Abnormal electrocardiogram [ECG] [EKG]: Secondary | ICD-10-CM | POA: Diagnosis not present

## 2019-09-16 DIAGNOSIS — D509 Iron deficiency anemia, unspecified: Secondary | ICD-10-CM | POA: Diagnosis not present

## 2019-09-16 DIAGNOSIS — M6281 Muscle weakness (generalized): Secondary | ICD-10-CM | POA: Diagnosis not present

## 2019-09-16 DIAGNOSIS — E785 Hyperlipidemia, unspecified: Secondary | ICD-10-CM | POA: Diagnosis not present

## 2019-09-16 DIAGNOSIS — B954 Other streptococcus as the cause of diseases classified elsewhere: Secondary | ICD-10-CM | POA: Diagnosis not present

## 2019-09-16 DIAGNOSIS — M4800 Spinal stenosis, site unspecified: Secondary | ICD-10-CM | POA: Diagnosis not present

## 2019-09-16 DIAGNOSIS — T8131XD Disruption of external operation (surgical) wound, not elsewhere classified, subsequent encounter: Secondary | ICD-10-CM | POA: Diagnosis not present

## 2019-09-16 DIAGNOSIS — R5381 Other malaise: Secondary | ICD-10-CM | POA: Diagnosis not present

## 2019-09-16 DIAGNOSIS — E569 Vitamin deficiency, unspecified: Secondary | ICD-10-CM | POA: Diagnosis not present

## 2019-09-16 DIAGNOSIS — G061 Intraspinal abscess and granuloma: Secondary | ICD-10-CM | POA: Diagnosis not present

## 2019-09-16 DIAGNOSIS — Z9889 Other specified postprocedural states: Secondary | ICD-10-CM | POA: Diagnosis not present

## 2019-09-16 DIAGNOSIS — D7219 Other eosinophilia: Secondary | ICD-10-CM | POA: Diagnosis not present

## 2019-09-16 DIAGNOSIS — N186 End stage renal disease: Secondary | ICD-10-CM | POA: Diagnosis not present

## 2019-09-16 DIAGNOSIS — R21 Rash and other nonspecific skin eruption: Secondary | ICD-10-CM | POA: Diagnosis not present

## 2019-09-16 DIAGNOSIS — I110 Hypertensive heart disease with heart failure: Secondary | ICD-10-CM | POA: Diagnosis not present

## 2019-09-16 DIAGNOSIS — L03116 Cellulitis of left lower limb: Secondary | ICD-10-CM | POA: Diagnosis not present

## 2019-09-16 DIAGNOSIS — R6521 Severe sepsis with septic shock: Secondary | ICD-10-CM | POA: Diagnosis not present

## 2019-09-16 DIAGNOSIS — M48061 Spinal stenosis, lumbar region without neurogenic claudication: Secondary | ICD-10-CM | POA: Diagnosis not present

## 2019-09-16 DIAGNOSIS — L89154 Pressure ulcer of sacral region, stage 4: Secondary | ICD-10-CM | POA: Diagnosis not present

## 2019-09-16 DIAGNOSIS — I509 Heart failure, unspecified: Secondary | ICD-10-CM | POA: Diagnosis not present

## 2019-09-16 DIAGNOSIS — L299 Pruritus, unspecified: Secondary | ICD-10-CM | POA: Diagnosis not present

## 2019-09-16 DIAGNOSIS — R410 Disorientation, unspecified: Secondary | ICD-10-CM | POA: Diagnosis not present

## 2019-09-16 DIAGNOSIS — K7460 Unspecified cirrhosis of liver: Secondary | ICD-10-CM | POA: Diagnosis not present

## 2019-09-16 DIAGNOSIS — R442 Other hallucinations: Secondary | ICD-10-CM | POA: Diagnosis not present

## 2019-09-17 DIAGNOSIS — Z9889 Other specified postprocedural states: Secondary | ICD-10-CM | POA: Diagnosis not present

## 2019-09-17 DIAGNOSIS — N179 Acute kidney failure, unspecified: Secondary | ICD-10-CM | POA: Diagnosis not present

## 2019-09-17 DIAGNOSIS — I5030 Unspecified diastolic (congestive) heart failure: Secondary | ICD-10-CM | POA: Diagnosis not present

## 2019-09-17 DIAGNOSIS — I110 Hypertensive heart disease with heart failure: Secondary | ICD-10-CM | POA: Diagnosis not present

## 2019-09-17 DIAGNOSIS — R531 Weakness: Secondary | ICD-10-CM | POA: Diagnosis not present

## 2019-09-17 DIAGNOSIS — L89154 Pressure ulcer of sacral region, stage 4: Secondary | ICD-10-CM | POA: Diagnosis not present

## 2019-09-17 DIAGNOSIS — I2510 Atherosclerotic heart disease of native coronary artery without angina pectoris: Secondary | ICD-10-CM | POA: Diagnosis not present

## 2019-09-17 DIAGNOSIS — M4628 Osteomyelitis of vertebra, sacral and sacrococcygeal region: Secondary | ICD-10-CM | POA: Diagnosis not present

## 2019-09-17 DIAGNOSIS — D509 Iron deficiency anemia, unspecified: Secondary | ICD-10-CM | POA: Diagnosis not present

## 2019-09-17 DIAGNOSIS — E872 Acidosis: Secondary | ICD-10-CM | POA: Diagnosis not present

## 2019-09-17 DIAGNOSIS — I959 Hypotension, unspecified: Secondary | ICD-10-CM | POA: Diagnosis not present

## 2019-09-18 DIAGNOSIS — G253 Myoclonus: Secondary | ICD-10-CM | POA: Diagnosis not present

## 2019-09-18 DIAGNOSIS — M4628 Osteomyelitis of vertebra, sacral and sacrococcygeal region: Secondary | ICD-10-CM | POA: Diagnosis not present

## 2019-09-18 DIAGNOSIS — I5030 Unspecified diastolic (congestive) heart failure: Secondary | ICD-10-CM | POA: Diagnosis not present

## 2019-09-18 DIAGNOSIS — R531 Weakness: Secondary | ICD-10-CM | POA: Diagnosis not present

## 2019-09-18 DIAGNOSIS — N179 Acute kidney failure, unspecified: Secondary | ICD-10-CM | POA: Diagnosis not present

## 2019-09-18 DIAGNOSIS — R4182 Altered mental status, unspecified: Secondary | ICD-10-CM | POA: Diagnosis not present

## 2019-09-18 DIAGNOSIS — I110 Hypertensive heart disease with heart failure: Secondary | ICD-10-CM | POA: Diagnosis not present

## 2019-09-18 DIAGNOSIS — E872 Acidosis: Secondary | ICD-10-CM | POA: Diagnosis not present

## 2019-09-18 DIAGNOSIS — L89154 Pressure ulcer of sacral region, stage 4: Secondary | ICD-10-CM | POA: Diagnosis not present

## 2019-09-18 DIAGNOSIS — N170 Acute kidney failure with tubular necrosis: Secondary | ICD-10-CM | POA: Diagnosis not present

## 2019-09-18 DIAGNOSIS — D509 Iron deficiency anemia, unspecified: Secondary | ICD-10-CM | POA: Diagnosis not present

## 2019-09-19 DIAGNOSIS — R531 Weakness: Secondary | ICD-10-CM | POA: Diagnosis not present

## 2019-09-19 DIAGNOSIS — M4626 Osteomyelitis of vertebra, lumbar region: Secondary | ICD-10-CM | POA: Diagnosis not present

## 2019-09-19 DIAGNOSIS — L89520 Pressure ulcer of left ankle, unstageable: Secondary | ICD-10-CM | POA: Diagnosis not present

## 2019-09-19 DIAGNOSIS — B954 Other streptococcus as the cause of diseases classified elsewhere: Secondary | ICD-10-CM | POA: Diagnosis not present

## 2019-09-19 DIAGNOSIS — S90822A Blister (nonthermal), left foot, initial encounter: Secondary | ICD-10-CM | POA: Diagnosis not present

## 2019-09-19 DIAGNOSIS — L89154 Pressure ulcer of sacral region, stage 4: Secondary | ICD-10-CM | POA: Diagnosis not present

## 2019-09-19 DIAGNOSIS — I10 Essential (primary) hypertension: Secondary | ICD-10-CM | POA: Diagnosis not present

## 2019-09-19 DIAGNOSIS — N170 Acute kidney failure with tubular necrosis: Secondary | ICD-10-CM | POA: Diagnosis not present

## 2019-09-19 DIAGNOSIS — L0231 Cutaneous abscess of buttock: Secondary | ICD-10-CM | POA: Diagnosis not present

## 2019-09-19 DIAGNOSIS — B961 Klebsiella pneumoniae [K. pneumoniae] as the cause of diseases classified elsewhere: Secondary | ICD-10-CM | POA: Diagnosis not present

## 2019-09-19 DIAGNOSIS — G061 Intraspinal abscess and granuloma: Secondary | ICD-10-CM | POA: Diagnosis not present

## 2019-09-19 DIAGNOSIS — E872 Acidosis: Secondary | ICD-10-CM | POA: Diagnosis not present

## 2019-09-19 DIAGNOSIS — G253 Myoclonus: Secondary | ICD-10-CM | POA: Diagnosis not present

## 2019-09-19 DIAGNOSIS — B952 Enterococcus as the cause of diseases classified elsewhere: Secondary | ICD-10-CM | POA: Diagnosis not present

## 2019-09-19 DIAGNOSIS — I959 Hypotension, unspecified: Secondary | ICD-10-CM | POA: Diagnosis not present

## 2019-09-19 DIAGNOSIS — I509 Heart failure, unspecified: Secondary | ICD-10-CM | POA: Diagnosis not present

## 2019-09-19 DIAGNOSIS — I110 Hypertensive heart disease with heart failure: Secondary | ICD-10-CM | POA: Diagnosis not present

## 2019-09-19 DIAGNOSIS — N179 Acute kidney failure, unspecified: Secondary | ICD-10-CM | POA: Diagnosis not present

## 2019-09-19 DIAGNOSIS — M4628 Osteomyelitis of vertebra, sacral and sacrococcygeal region: Secondary | ICD-10-CM | POA: Diagnosis not present

## 2019-09-19 DIAGNOSIS — M6281 Muscle weakness (generalized): Secondary | ICD-10-CM | POA: Diagnosis not present

## 2019-09-19 DIAGNOSIS — B964 Proteus (mirabilis) (morganii) as the cause of diseases classified elsewhere: Secondary | ICD-10-CM | POA: Diagnosis not present

## 2019-09-19 DIAGNOSIS — B9620 Unspecified Escherichia coli [E. coli] as the cause of diseases classified elsewhere: Secondary | ICD-10-CM | POA: Diagnosis not present

## 2019-09-19 DIAGNOSIS — L89314 Pressure ulcer of right buttock, stage 4: Secondary | ICD-10-CM | POA: Diagnosis not present

## 2019-09-19 DIAGNOSIS — R4182 Altered mental status, unspecified: Secondary | ICD-10-CM | POA: Diagnosis not present

## 2019-09-19 DIAGNOSIS — D509 Iron deficiency anemia, unspecified: Secondary | ICD-10-CM | POA: Diagnosis not present

## 2019-09-20 DIAGNOSIS — I959 Hypotension, unspecified: Secondary | ICD-10-CM | POA: Diagnosis not present

## 2019-09-20 DIAGNOSIS — L89154 Pressure ulcer of sacral region, stage 4: Secondary | ICD-10-CM | POA: Diagnosis not present

## 2019-09-20 DIAGNOSIS — I2510 Atherosclerotic heart disease of native coronary artery without angina pectoris: Secondary | ICD-10-CM | POA: Diagnosis not present

## 2019-09-20 DIAGNOSIS — I358 Other nonrheumatic aortic valve disorders: Secondary | ICD-10-CM | POA: Diagnosis not present

## 2019-09-20 DIAGNOSIS — E785 Hyperlipidemia, unspecified: Secondary | ICD-10-CM | POA: Diagnosis not present

## 2019-09-20 DIAGNOSIS — N179 Acute kidney failure, unspecified: Secondary | ICD-10-CM | POA: Diagnosis not present

## 2019-09-20 DIAGNOSIS — G062 Extradural and subdural abscess, unspecified: Secondary | ICD-10-CM | POA: Diagnosis not present

## 2019-09-20 DIAGNOSIS — D509 Iron deficiency anemia, unspecified: Secondary | ICD-10-CM | POA: Diagnosis not present

## 2019-09-20 DIAGNOSIS — K7460 Unspecified cirrhosis of liver: Secondary | ICD-10-CM | POA: Diagnosis not present

## 2019-09-20 DIAGNOSIS — M868X8 Other osteomyelitis, other site: Secondary | ICD-10-CM | POA: Diagnosis not present

## 2019-09-20 DIAGNOSIS — I351 Nonrheumatic aortic (valve) insufficiency: Secondary | ICD-10-CM | POA: Diagnosis not present

## 2019-09-20 DIAGNOSIS — N170 Acute kidney failure with tubular necrosis: Secondary | ICD-10-CM | POA: Diagnosis not present

## 2019-09-20 DIAGNOSIS — I110 Hypertensive heart disease with heart failure: Secondary | ICD-10-CM | POA: Diagnosis not present

## 2019-09-20 DIAGNOSIS — E872 Acidosis: Secondary | ICD-10-CM | POA: Diagnosis not present

## 2019-09-20 DIAGNOSIS — M4626 Osteomyelitis of vertebra, lumbar region: Secondary | ICD-10-CM | POA: Diagnosis not present

## 2019-09-20 DIAGNOSIS — G459 Transient cerebral ischemic attack, unspecified: Secondary | ICD-10-CM | POA: Diagnosis not present

## 2019-09-21 DIAGNOSIS — B952 Enterococcus as the cause of diseases classified elsewhere: Secondary | ICD-10-CM | POA: Diagnosis not present

## 2019-09-21 DIAGNOSIS — D509 Iron deficiency anemia, unspecified: Secondary | ICD-10-CM | POA: Diagnosis not present

## 2019-09-21 DIAGNOSIS — E872 Acidosis: Secondary | ICD-10-CM | POA: Diagnosis not present

## 2019-09-21 DIAGNOSIS — M868X8 Other osteomyelitis, other site: Secondary | ICD-10-CM | POA: Diagnosis not present

## 2019-09-21 DIAGNOSIS — E8809 Other disorders of plasma-protein metabolism, not elsewhere classified: Secondary | ICD-10-CM | POA: Diagnosis not present

## 2019-09-21 DIAGNOSIS — B964 Proteus (mirabilis) (morganii) as the cause of diseases classified elsewhere: Secondary | ICD-10-CM | POA: Diagnosis not present

## 2019-09-21 DIAGNOSIS — M4628 Osteomyelitis of vertebra, sacral and sacrococcygeal region: Secondary | ICD-10-CM | POA: Diagnosis not present

## 2019-09-21 DIAGNOSIS — L0231 Cutaneous abscess of buttock: Secondary | ICD-10-CM | POA: Diagnosis not present

## 2019-09-21 DIAGNOSIS — L89154 Pressure ulcer of sacral region, stage 4: Secondary | ICD-10-CM | POA: Diagnosis not present

## 2019-09-21 DIAGNOSIS — N179 Acute kidney failure, unspecified: Secondary | ICD-10-CM | POA: Diagnosis not present

## 2019-09-21 DIAGNOSIS — I351 Nonrheumatic aortic (valve) insufficiency: Secondary | ICD-10-CM | POA: Diagnosis not present

## 2019-09-21 DIAGNOSIS — I358 Other nonrheumatic aortic valve disorders: Secondary | ICD-10-CM | POA: Diagnosis not present

## 2019-09-21 DIAGNOSIS — B961 Klebsiella pneumoniae [K. pneumoniae] as the cause of diseases classified elsewhere: Secondary | ICD-10-CM | POA: Diagnosis not present

## 2019-09-21 DIAGNOSIS — B954 Other streptococcus as the cause of diseases classified elsewhere: Secondary | ICD-10-CM | POA: Diagnosis not present

## 2019-09-21 DIAGNOSIS — N170 Acute kidney failure with tubular necrosis: Secondary | ICD-10-CM | POA: Diagnosis not present

## 2019-09-21 DIAGNOSIS — B9620 Unspecified Escherichia coli [E. coli] as the cause of diseases classified elsewhere: Secondary | ICD-10-CM | POA: Diagnosis not present

## 2019-09-22 DIAGNOSIS — E872 Acidosis: Secondary | ICD-10-CM | POA: Diagnosis not present

## 2019-09-22 DIAGNOSIS — I358 Other nonrheumatic aortic valve disorders: Secondary | ICD-10-CM | POA: Diagnosis not present

## 2019-09-22 DIAGNOSIS — D509 Iron deficiency anemia, unspecified: Secondary | ICD-10-CM | POA: Diagnosis not present

## 2019-09-22 DIAGNOSIS — N179 Acute kidney failure, unspecified: Secondary | ICD-10-CM | POA: Diagnosis not present

## 2019-09-22 DIAGNOSIS — L89154 Pressure ulcer of sacral region, stage 4: Secondary | ICD-10-CM | POA: Diagnosis not present

## 2019-09-22 DIAGNOSIS — M4628 Osteomyelitis of vertebra, sacral and sacrococcygeal region: Secondary | ICD-10-CM | POA: Diagnosis not present

## 2019-09-22 DIAGNOSIS — N170 Acute kidney failure with tubular necrosis: Secondary | ICD-10-CM | POA: Diagnosis not present

## 2019-09-22 DIAGNOSIS — I351 Nonrheumatic aortic (valve) insufficiency: Secondary | ICD-10-CM | POA: Diagnosis not present

## 2019-09-22 DIAGNOSIS — K7030 Alcoholic cirrhosis of liver without ascites: Secondary | ICD-10-CM | POA: Diagnosis not present

## 2019-09-22 DIAGNOSIS — I5030 Unspecified diastolic (congestive) heart failure: Secondary | ICD-10-CM | POA: Diagnosis not present

## 2019-09-22 DIAGNOSIS — I110 Hypertensive heart disease with heart failure: Secondary | ICD-10-CM | POA: Diagnosis not present

## 2019-09-22 DIAGNOSIS — I959 Hypotension, unspecified: Secondary | ICD-10-CM | POA: Diagnosis not present

## 2019-09-23 DIAGNOSIS — Q231 Congenital insufficiency of aortic valve: Secondary | ICD-10-CM | POA: Diagnosis not present

## 2019-09-23 DIAGNOSIS — D509 Iron deficiency anemia, unspecified: Secondary | ICD-10-CM | POA: Diagnosis not present

## 2019-09-23 DIAGNOSIS — A1801 Tuberculosis of spine: Secondary | ICD-10-CM | POA: Diagnosis not present

## 2019-09-23 DIAGNOSIS — R34 Anuria and oliguria: Secondary | ICD-10-CM | POA: Diagnosis not present

## 2019-09-23 DIAGNOSIS — L89154 Pressure ulcer of sacral region, stage 4: Secondary | ICD-10-CM | POA: Diagnosis not present

## 2019-09-23 DIAGNOSIS — N179 Acute kidney failure, unspecified: Secondary | ICD-10-CM | POA: Diagnosis not present

## 2019-09-23 DIAGNOSIS — I959 Hypotension, unspecified: Secondary | ICD-10-CM | POA: Diagnosis not present

## 2019-09-23 DIAGNOSIS — E8809 Other disorders of plasma-protein metabolism, not elsewhere classified: Secondary | ICD-10-CM | POA: Diagnosis not present

## 2019-09-24 DIAGNOSIS — D509 Iron deficiency anemia, unspecified: Secondary | ICD-10-CM | POA: Diagnosis not present

## 2019-09-24 DIAGNOSIS — Q231 Congenital insufficiency of aortic valve: Secondary | ICD-10-CM | POA: Diagnosis not present

## 2019-09-24 DIAGNOSIS — E8809 Other disorders of plasma-protein metabolism, not elsewhere classified: Secondary | ICD-10-CM | POA: Diagnosis not present

## 2019-09-24 DIAGNOSIS — N179 Acute kidney failure, unspecified: Secondary | ICD-10-CM | POA: Diagnosis not present

## 2019-09-24 DIAGNOSIS — I959 Hypotension, unspecified: Secondary | ICD-10-CM | POA: Diagnosis not present

## 2019-09-24 DIAGNOSIS — L89154 Pressure ulcer of sacral region, stage 4: Secondary | ICD-10-CM | POA: Diagnosis not present

## 2019-09-24 DIAGNOSIS — M4626 Osteomyelitis of vertebra, lumbar region: Secondary | ICD-10-CM | POA: Diagnosis not present

## 2019-09-24 DIAGNOSIS — A1801 Tuberculosis of spine: Secondary | ICD-10-CM | POA: Diagnosis not present

## 2019-09-25 DIAGNOSIS — E8809 Other disorders of plasma-protein metabolism, not elsewhere classified: Secondary | ICD-10-CM | POA: Diagnosis not present

## 2019-09-25 DIAGNOSIS — M4626 Osteomyelitis of vertebra, lumbar region: Secondary | ICD-10-CM | POA: Diagnosis not present

## 2019-09-25 DIAGNOSIS — L89154 Pressure ulcer of sacral region, stage 4: Secondary | ICD-10-CM | POA: Diagnosis not present

## 2019-09-25 DIAGNOSIS — A1801 Tuberculosis of spine: Secondary | ICD-10-CM | POA: Diagnosis not present

## 2019-09-25 DIAGNOSIS — D509 Iron deficiency anemia, unspecified: Secondary | ICD-10-CM | POA: Diagnosis not present

## 2019-09-25 DIAGNOSIS — I959 Hypotension, unspecified: Secondary | ICD-10-CM | POA: Diagnosis not present

## 2019-09-25 DIAGNOSIS — N179 Acute kidney failure, unspecified: Secondary | ICD-10-CM | POA: Diagnosis not present

## 2019-09-25 DIAGNOSIS — Q231 Congenital insufficiency of aortic valve: Secondary | ICD-10-CM | POA: Diagnosis not present

## 2019-09-26 DIAGNOSIS — I2510 Atherosclerotic heart disease of native coronary artery without angina pectoris: Secondary | ICD-10-CM | POA: Diagnosis not present

## 2019-09-26 DIAGNOSIS — N170 Acute kidney failure with tubular necrosis: Secondary | ICD-10-CM | POA: Diagnosis not present

## 2019-09-26 DIAGNOSIS — Z9889 Other specified postprocedural states: Secondary | ICD-10-CM | POA: Diagnosis not present

## 2019-09-26 DIAGNOSIS — I351 Nonrheumatic aortic (valve) insufficiency: Secondary | ICD-10-CM | POA: Diagnosis not present

## 2019-09-26 DIAGNOSIS — D7219 Other eosinophilia: Secondary | ICD-10-CM | POA: Diagnosis not present

## 2019-09-26 DIAGNOSIS — K766 Portal hypertension: Secondary | ICD-10-CM | POA: Diagnosis not present

## 2019-09-26 DIAGNOSIS — I5032 Chronic diastolic (congestive) heart failure: Secondary | ICD-10-CM | POA: Diagnosis not present

## 2019-09-26 DIAGNOSIS — M4628 Osteomyelitis of vertebra, sacral and sacrococcygeal region: Secondary | ICD-10-CM | POA: Diagnosis not present

## 2019-09-26 DIAGNOSIS — A1801 Tuberculosis of spine: Secondary | ICD-10-CM | POA: Diagnosis not present

## 2019-09-26 DIAGNOSIS — I959 Hypotension, unspecified: Secondary | ICD-10-CM | POA: Diagnosis not present

## 2019-09-26 DIAGNOSIS — K7030 Alcoholic cirrhosis of liver without ascites: Secondary | ICD-10-CM | POA: Diagnosis not present

## 2019-09-26 DIAGNOSIS — L89154 Pressure ulcer of sacral region, stage 4: Secondary | ICD-10-CM | POA: Diagnosis not present

## 2019-09-26 DIAGNOSIS — M4626 Osteomyelitis of vertebra, lumbar region: Secondary | ICD-10-CM | POA: Diagnosis not present

## 2019-09-26 DIAGNOSIS — A499 Bacterial infection, unspecified: Secondary | ICD-10-CM | POA: Diagnosis not present

## 2019-09-26 DIAGNOSIS — D509 Iron deficiency anemia, unspecified: Secondary | ICD-10-CM | POA: Diagnosis not present

## 2019-09-26 DIAGNOSIS — N179 Acute kidney failure, unspecified: Secondary | ICD-10-CM | POA: Diagnosis not present

## 2019-09-26 DIAGNOSIS — I8510 Secondary esophageal varices without bleeding: Secondary | ICD-10-CM | POA: Diagnosis not present

## 2019-09-27 DIAGNOSIS — M4626 Osteomyelitis of vertebra, lumbar region: Secondary | ICD-10-CM | POA: Diagnosis not present

## 2019-09-27 DIAGNOSIS — L03116 Cellulitis of left lower limb: Secondary | ICD-10-CM | POA: Diagnosis not present

## 2019-09-27 DIAGNOSIS — R21 Rash and other nonspecific skin eruption: Secondary | ICD-10-CM | POA: Diagnosis not present

## 2019-09-27 DIAGNOSIS — A4189 Other specified sepsis: Secondary | ICD-10-CM | POA: Diagnosis not present

## 2019-09-27 DIAGNOSIS — L89154 Pressure ulcer of sacral region, stage 4: Secondary | ICD-10-CM | POA: Diagnosis not present

## 2019-09-27 DIAGNOSIS — R6521 Severe sepsis with septic shock: Secondary | ICD-10-CM | POA: Diagnosis not present

## 2019-09-27 DIAGNOSIS — M4628 Osteomyelitis of vertebra, sacral and sacrococcygeal region: Secondary | ICD-10-CM | POA: Diagnosis not present

## 2019-09-27 DIAGNOSIS — N179 Acute kidney failure, unspecified: Secondary | ICD-10-CM | POA: Diagnosis not present

## 2019-09-27 DIAGNOSIS — I959 Hypotension, unspecified: Secondary | ICD-10-CM | POA: Diagnosis not present

## 2019-09-27 DIAGNOSIS — A499 Bacterial infection, unspecified: Secondary | ICD-10-CM | POA: Diagnosis not present

## 2019-09-27 DIAGNOSIS — I119 Hypertensive heart disease without heart failure: Secondary | ICD-10-CM | POA: Diagnosis not present

## 2019-09-27 DIAGNOSIS — I509 Heart failure, unspecified: Secondary | ICD-10-CM | POA: Diagnosis not present

## 2019-09-27 DIAGNOSIS — S90822A Blister (nonthermal), left foot, initial encounter: Secondary | ICD-10-CM | POA: Diagnosis not present

## 2019-09-27 DIAGNOSIS — M8628 Subacute osteomyelitis, other site: Secondary | ICD-10-CM | POA: Diagnosis not present

## 2019-09-27 DIAGNOSIS — D7219 Other eosinophilia: Secondary | ICD-10-CM | POA: Diagnosis not present

## 2019-09-27 DIAGNOSIS — L89520 Pressure ulcer of left ankle, unstageable: Secondary | ICD-10-CM | POA: Diagnosis not present

## 2019-09-28 DIAGNOSIS — L299 Pruritus, unspecified: Secondary | ICD-10-CM | POA: Diagnosis not present

## 2019-09-28 DIAGNOSIS — R410 Disorientation, unspecified: Secondary | ICD-10-CM | POA: Diagnosis not present

## 2019-09-28 DIAGNOSIS — M868X8 Other osteomyelitis, other site: Secondary | ICD-10-CM | POA: Diagnosis not present

## 2019-09-28 DIAGNOSIS — L0231 Cutaneous abscess of buttock: Secondary | ICD-10-CM | POA: Diagnosis not present

## 2019-09-28 DIAGNOSIS — M4628 Osteomyelitis of vertebra, sacral and sacrococcygeal region: Secondary | ICD-10-CM | POA: Diagnosis not present

## 2019-09-28 DIAGNOSIS — B9620 Unspecified Escherichia coli [E. coli] as the cause of diseases classified elsewhere: Secondary | ICD-10-CM | POA: Diagnosis not present

## 2019-09-28 DIAGNOSIS — R6521 Severe sepsis with septic shock: Secondary | ICD-10-CM | POA: Diagnosis not present

## 2019-09-28 DIAGNOSIS — N179 Acute kidney failure, unspecified: Secondary | ICD-10-CM | POA: Diagnosis not present

## 2019-09-28 DIAGNOSIS — I959 Hypotension, unspecified: Secondary | ICD-10-CM | POA: Diagnosis not present

## 2019-09-28 DIAGNOSIS — L89154 Pressure ulcer of sacral region, stage 4: Secondary | ICD-10-CM | POA: Diagnosis not present

## 2019-09-28 DIAGNOSIS — D721 Eosinophilia, unspecified: Secondary | ICD-10-CM | POA: Diagnosis not present

## 2019-09-28 DIAGNOSIS — R21 Rash and other nonspecific skin eruption: Secondary | ICD-10-CM | POA: Diagnosis not present

## 2019-09-29 DIAGNOSIS — R442 Other hallucinations: Secondary | ICD-10-CM | POA: Diagnosis not present

## 2019-09-29 DIAGNOSIS — M4628 Osteomyelitis of vertebra, sacral and sacrococcygeal region: Secondary | ICD-10-CM | POA: Diagnosis not present

## 2019-09-29 DIAGNOSIS — R6521 Severe sepsis with septic shock: Secondary | ICD-10-CM | POA: Diagnosis not present

## 2019-09-29 DIAGNOSIS — L299 Pruritus, unspecified: Secondary | ICD-10-CM | POA: Diagnosis not present

## 2019-09-29 DIAGNOSIS — D721 Eosinophilia, unspecified: Secondary | ICD-10-CM | POA: Diagnosis not present

## 2019-09-29 DIAGNOSIS — R21 Rash and other nonspecific skin eruption: Secondary | ICD-10-CM | POA: Diagnosis not present

## 2019-09-29 DIAGNOSIS — N179 Acute kidney failure, unspecified: Secondary | ICD-10-CM | POA: Diagnosis not present

## 2019-09-29 DIAGNOSIS — R410 Disorientation, unspecified: Secondary | ICD-10-CM | POA: Diagnosis not present

## 2019-09-29 DIAGNOSIS — I959 Hypotension, unspecified: Secondary | ICD-10-CM | POA: Diagnosis not present

## 2019-09-29 DIAGNOSIS — L89154 Pressure ulcer of sacral region, stage 4: Secondary | ICD-10-CM | POA: Diagnosis not present

## 2019-09-29 DIAGNOSIS — B9620 Unspecified Escherichia coli [E. coli] as the cause of diseases classified elsewhere: Secondary | ICD-10-CM | POA: Diagnosis not present

## 2019-09-29 DIAGNOSIS — A4189 Other specified sepsis: Secondary | ICD-10-CM | POA: Diagnosis not present

## 2019-09-29 DIAGNOSIS — L0231 Cutaneous abscess of buttock: Secondary | ICD-10-CM | POA: Diagnosis not present

## 2019-09-30 DIAGNOSIS — D721 Eosinophilia, unspecified: Secondary | ICD-10-CM | POA: Diagnosis not present

## 2019-09-30 DIAGNOSIS — D7212 Drug rash with eosinophilia and systemic symptoms syndrome: Secondary | ICD-10-CM | POA: Diagnosis not present

## 2019-09-30 DIAGNOSIS — M4628 Osteomyelitis of vertebra, sacral and sacrococcygeal region: Secondary | ICD-10-CM | POA: Diagnosis not present

## 2019-09-30 DIAGNOSIS — N179 Acute kidney failure, unspecified: Secondary | ICD-10-CM | POA: Diagnosis not present

## 2019-09-30 DIAGNOSIS — A4189 Other specified sepsis: Secondary | ICD-10-CM | POA: Diagnosis not present

## 2019-09-30 DIAGNOSIS — B9620 Unspecified Escherichia coli [E. coli] as the cause of diseases classified elsewhere: Secondary | ICD-10-CM | POA: Diagnosis not present

## 2019-09-30 DIAGNOSIS — L0231 Cutaneous abscess of buttock: Secondary | ICD-10-CM | POA: Diagnosis not present

## 2019-09-30 DIAGNOSIS — J8289 Other pulmonary eosinophilia, not elsewhere classified: Secondary | ICD-10-CM | POA: Diagnosis not present

## 2019-09-30 DIAGNOSIS — R410 Disorientation, unspecified: Secondary | ICD-10-CM | POA: Diagnosis not present

## 2019-09-30 DIAGNOSIS — R6521 Severe sepsis with septic shock: Secondary | ICD-10-CM | POA: Diagnosis not present

## 2019-09-30 DIAGNOSIS — R442 Other hallucinations: Secondary | ICD-10-CM | POA: Diagnosis not present

## 2019-09-30 DIAGNOSIS — I959 Hypotension, unspecified: Secondary | ICD-10-CM | POA: Diagnosis not present

## 2019-09-30 DIAGNOSIS — L299 Pruritus, unspecified: Secondary | ICD-10-CM | POA: Diagnosis not present

## 2019-09-30 DIAGNOSIS — L89154 Pressure ulcer of sacral region, stage 4: Secondary | ICD-10-CM | POA: Diagnosis not present

## 2019-09-30 DIAGNOSIS — R21 Rash and other nonspecific skin eruption: Secondary | ICD-10-CM | POA: Diagnosis not present

## 2019-10-01 DIAGNOSIS — M4800 Spinal stenosis, site unspecified: Secondary | ICD-10-CM | POA: Diagnosis not present

## 2019-10-01 DIAGNOSIS — N179 Acute kidney failure, unspecified: Secondary | ICD-10-CM | POA: Diagnosis not present

## 2019-10-01 DIAGNOSIS — L89154 Pressure ulcer of sacral region, stage 4: Secondary | ICD-10-CM | POA: Diagnosis not present

## 2019-10-01 DIAGNOSIS — D7212 Drug rash with eosinophilia and systemic symptoms syndrome: Secondary | ICD-10-CM | POA: Diagnosis not present

## 2019-10-02 DIAGNOSIS — K7460 Unspecified cirrhosis of liver: Secondary | ICD-10-CM | POA: Diagnosis not present

## 2019-10-02 DIAGNOSIS — D7212 Drug rash with eosinophilia and systemic symptoms syndrome: Secondary | ICD-10-CM | POA: Diagnosis not present

## 2019-10-02 DIAGNOSIS — M4800 Spinal stenosis, site unspecified: Secondary | ICD-10-CM | POA: Diagnosis not present

## 2019-10-02 DIAGNOSIS — A1801 Tuberculosis of spine: Secondary | ICD-10-CM | POA: Diagnosis not present

## 2019-10-02 DIAGNOSIS — N179 Acute kidney failure, unspecified: Secondary | ICD-10-CM | POA: Diagnosis not present

## 2019-10-02 DIAGNOSIS — L89154 Pressure ulcer of sacral region, stage 4: Secondary | ICD-10-CM | POA: Diagnosis not present

## 2019-10-02 DIAGNOSIS — M4626 Osteomyelitis of vertebra, lumbar region: Secondary | ICD-10-CM | POA: Diagnosis not present

## 2019-10-03 DIAGNOSIS — L299 Pruritus, unspecified: Secondary | ICD-10-CM | POA: Diagnosis not present

## 2019-10-03 DIAGNOSIS — I959 Hypotension, unspecified: Secondary | ICD-10-CM | POA: Diagnosis not present

## 2019-10-03 DIAGNOSIS — B9620 Unspecified Escherichia coli [E. coli] as the cause of diseases classified elsewhere: Secondary | ICD-10-CM | POA: Diagnosis not present

## 2019-10-03 DIAGNOSIS — I351 Nonrheumatic aortic (valve) insufficiency: Secondary | ICD-10-CM | POA: Diagnosis not present

## 2019-10-03 DIAGNOSIS — L89154 Pressure ulcer of sacral region, stage 4: Secondary | ICD-10-CM | POA: Diagnosis not present

## 2019-10-03 DIAGNOSIS — M4628 Osteomyelitis of vertebra, sacral and sacrococcygeal region: Secondary | ICD-10-CM | POA: Diagnosis not present

## 2019-10-03 DIAGNOSIS — N179 Acute kidney failure, unspecified: Secondary | ICD-10-CM | POA: Diagnosis not present

## 2019-10-03 DIAGNOSIS — D721 Eosinophilia, unspecified: Secondary | ICD-10-CM | POA: Diagnosis not present

## 2019-10-03 DIAGNOSIS — R21 Rash and other nonspecific skin eruption: Secondary | ICD-10-CM | POA: Diagnosis not present

## 2019-10-03 DIAGNOSIS — L0231 Cutaneous abscess of buttock: Secondary | ICD-10-CM | POA: Diagnosis not present

## 2019-10-04 DIAGNOSIS — I110 Hypertensive heart disease with heart failure: Secondary | ICD-10-CM | POA: Diagnosis not present

## 2019-10-04 DIAGNOSIS — I5030 Unspecified diastolic (congestive) heart failure: Secondary | ICD-10-CM | POA: Diagnosis not present

## 2019-10-04 DIAGNOSIS — N179 Acute kidney failure, unspecified: Secondary | ICD-10-CM | POA: Diagnosis not present

## 2019-10-05 DIAGNOSIS — N179 Acute kidney failure, unspecified: Secondary | ICD-10-CM | POA: Diagnosis not present

## 2019-10-07 DIAGNOSIS — R9431 Abnormal electrocardiogram [ECG] [EKG]: Secondary | ICD-10-CM | POA: Diagnosis not present

## 2019-10-07 DIAGNOSIS — K766 Portal hypertension: Secondary | ICD-10-CM | POA: Diagnosis not present

## 2019-10-07 DIAGNOSIS — Z7901 Long term (current) use of anticoagulants: Secondary | ICD-10-CM | POA: Diagnosis not present

## 2019-10-07 DIAGNOSIS — L89620 Pressure ulcer of left heel, unstageable: Secondary | ICD-10-CM | POA: Diagnosis not present

## 2019-10-07 DIAGNOSIS — T8130XD Disruption of wound, unspecified, subsequent encounter: Secondary | ICD-10-CM | POA: Diagnosis not present

## 2019-10-07 DIAGNOSIS — N319 Neuromuscular dysfunction of bladder, unspecified: Secondary | ICD-10-CM | POA: Diagnosis not present

## 2019-10-07 DIAGNOSIS — R319 Hematuria, unspecified: Secondary | ICD-10-CM | POA: Diagnosis not present

## 2019-10-07 DIAGNOSIS — I959 Hypotension, unspecified: Secondary | ICD-10-CM | POA: Diagnosis not present

## 2019-10-07 DIAGNOSIS — L89154 Pressure ulcer of sacral region, stage 4: Secondary | ICD-10-CM | POA: Diagnosis not present

## 2019-10-07 DIAGNOSIS — D72829 Elevated white blood cell count, unspecified: Secondary | ICD-10-CM | POA: Diagnosis not present

## 2019-10-07 DIAGNOSIS — M4626 Osteomyelitis of vertebra, lumbar region: Secondary | ICD-10-CM | POA: Diagnosis not present

## 2019-10-07 DIAGNOSIS — Z9889 Other specified postprocedural states: Secondary | ICD-10-CM | POA: Diagnosis not present

## 2019-10-07 DIAGNOSIS — T8131XD Disruption of external operation (surgical) wound, not elsewhere classified, subsequent encounter: Secondary | ICD-10-CM | POA: Diagnosis not present

## 2019-10-07 DIAGNOSIS — I5033 Acute on chronic diastolic (congestive) heart failure: Secondary | ICD-10-CM | POA: Diagnosis not present

## 2019-10-07 DIAGNOSIS — M545 Low back pain: Secondary | ICD-10-CM | POA: Diagnosis not present

## 2019-10-07 DIAGNOSIS — A499 Bacterial infection, unspecified: Secondary | ICD-10-CM | POA: Diagnosis not present

## 2019-10-07 DIAGNOSIS — L89520 Pressure ulcer of left ankle, unstageable: Secondary | ICD-10-CM | POA: Diagnosis not present

## 2019-10-07 DIAGNOSIS — B9620 Unspecified Escherichia coli [E. coli] as the cause of diseases classified elsewhere: Secondary | ICD-10-CM | POA: Diagnosis not present

## 2019-10-07 DIAGNOSIS — L0231 Cutaneous abscess of buttock: Secondary | ICD-10-CM | POA: Diagnosis not present

## 2019-10-07 DIAGNOSIS — A419 Sepsis, unspecified organism: Secondary | ICD-10-CM | POA: Diagnosis not present

## 2019-10-07 DIAGNOSIS — G9009 Other idiopathic peripheral autonomic neuropathy: Secondary | ICD-10-CM | POA: Diagnosis not present

## 2019-10-07 DIAGNOSIS — R300 Dysuria: Secondary | ICD-10-CM | POA: Diagnosis not present

## 2019-10-07 DIAGNOSIS — D7219 Other eosinophilia: Secondary | ICD-10-CM | POA: Diagnosis not present

## 2019-10-07 DIAGNOSIS — N390 Urinary tract infection, site not specified: Secondary | ICD-10-CM | POA: Diagnosis not present

## 2019-10-07 DIAGNOSIS — M7989 Other specified soft tissue disorders: Secondary | ICD-10-CM | POA: Diagnosis not present

## 2019-10-07 DIAGNOSIS — I10 Essential (primary) hypertension: Secondary | ICD-10-CM | POA: Diagnosis not present

## 2019-10-07 DIAGNOSIS — M6281 Muscle weakness (generalized): Secondary | ICD-10-CM | POA: Diagnosis not present

## 2019-10-07 DIAGNOSIS — R410 Disorientation, unspecified: Secondary | ICD-10-CM | POA: Diagnosis not present

## 2019-10-07 DIAGNOSIS — I2510 Atherosclerotic heart disease of native coronary artery without angina pectoris: Secondary | ICD-10-CM | POA: Diagnosis not present

## 2019-10-07 DIAGNOSIS — Z743 Need for continuous supervision: Secondary | ICD-10-CM | POA: Diagnosis not present

## 2019-10-07 DIAGNOSIS — M533 Sacrococcygeal disorders, not elsewhere classified: Secondary | ICD-10-CM | POA: Diagnosis not present

## 2019-10-07 DIAGNOSIS — I5032 Chronic diastolic (congestive) heart failure: Secondary | ICD-10-CM | POA: Diagnosis not present

## 2019-10-07 DIAGNOSIS — R627 Adult failure to thrive: Secondary | ICD-10-CM | POA: Diagnosis not present

## 2019-10-07 DIAGNOSIS — R609 Edema, unspecified: Secondary | ICD-10-CM | POA: Diagnosis not present

## 2019-10-07 DIAGNOSIS — M4628 Osteomyelitis of vertebra, sacral and sacrococcygeal region: Secondary | ICD-10-CM | POA: Diagnosis not present

## 2019-10-07 DIAGNOSIS — R112 Nausea with vomiting, unspecified: Secondary | ICD-10-CM | POA: Diagnosis not present

## 2019-10-07 DIAGNOSIS — T8131XA Disruption of external operation (surgical) wound, not elsewhere classified, initial encounter: Secondary | ICD-10-CM | POA: Diagnosis not present

## 2019-10-07 DIAGNOSIS — A601 Herpesviral infection of perianal skin and rectum: Secondary | ICD-10-CM | POA: Diagnosis not present

## 2019-10-07 DIAGNOSIS — R7881 Bacteremia: Secondary | ICD-10-CM | POA: Diagnosis not present

## 2019-10-07 DIAGNOSIS — R0902 Hypoxemia: Secondary | ICD-10-CM | POA: Diagnosis not present

## 2019-10-07 DIAGNOSIS — Z87891 Personal history of nicotine dependence: Secondary | ICD-10-CM | POA: Diagnosis not present

## 2019-10-07 DIAGNOSIS — D649 Anemia, unspecified: Secondary | ICD-10-CM | POA: Diagnosis not present

## 2019-10-07 DIAGNOSIS — Z7401 Bed confinement status: Secondary | ICD-10-CM | POA: Diagnosis not present

## 2019-10-07 DIAGNOSIS — U071 COVID-19: Secondary | ICD-10-CM | POA: Diagnosis not present

## 2019-10-07 DIAGNOSIS — M48061 Spinal stenosis, lumbar region without neurogenic claudication: Secondary | ICD-10-CM | POA: Diagnosis not present

## 2019-10-07 DIAGNOSIS — R000 Tachycardia, unspecified: Secondary | ICD-10-CM | POA: Diagnosis not present

## 2019-10-07 DIAGNOSIS — I110 Hypertensive heart disease with heart failure: Secondary | ICD-10-CM | POA: Diagnosis not present

## 2019-10-07 DIAGNOSIS — T8189XD Other complications of procedures, not elsewhere classified, subsequent encounter: Secondary | ICD-10-CM | POA: Diagnosis not present

## 2019-10-07 DIAGNOSIS — E569 Vitamin deficiency, unspecified: Secondary | ICD-10-CM | POA: Diagnosis not present

## 2019-10-07 DIAGNOSIS — B999 Unspecified infectious disease: Secondary | ICD-10-CM | POA: Diagnosis not present

## 2019-10-07 DIAGNOSIS — L03115 Cellulitis of right lower limb: Secondary | ICD-10-CM | POA: Diagnosis not present

## 2019-10-07 DIAGNOSIS — N170 Acute kidney failure with tubular necrosis: Secondary | ICD-10-CM | POA: Diagnosis not present

## 2019-10-07 DIAGNOSIS — L89613 Pressure ulcer of right heel, stage 3: Secondary | ICD-10-CM | POA: Diagnosis not present

## 2019-10-07 DIAGNOSIS — Z87898 Personal history of other specified conditions: Secondary | ICD-10-CM | POA: Diagnosis not present

## 2019-10-07 DIAGNOSIS — Z79899 Other long term (current) drug therapy: Secondary | ICD-10-CM | POA: Diagnosis not present

## 2019-10-07 DIAGNOSIS — Z114 Encounter for screening for human immunodeficiency virus [HIV]: Secondary | ICD-10-CM | POA: Diagnosis not present

## 2019-10-07 DIAGNOSIS — K7030 Alcoholic cirrhosis of liver without ascites: Secondary | ICD-10-CM | POA: Diagnosis not present

## 2019-10-07 DIAGNOSIS — R601 Generalized edema: Secondary | ICD-10-CM | POA: Diagnosis not present

## 2019-10-07 DIAGNOSIS — L89623 Pressure ulcer of left heel, stage 3: Secondary | ICD-10-CM | POA: Diagnosis not present

## 2019-10-07 DIAGNOSIS — G9341 Metabolic encephalopathy: Secondary | ICD-10-CM | POA: Diagnosis not present

## 2019-10-07 DIAGNOSIS — L270 Generalized skin eruption due to drugs and medicaments taken internally: Secondary | ICD-10-CM | POA: Diagnosis not present

## 2019-10-07 DIAGNOSIS — Z992 Dependence on renal dialysis: Secondary | ICD-10-CM | POA: Diagnosis not present

## 2019-10-07 DIAGNOSIS — M868X8 Other osteomyelitis, other site: Secondary | ICD-10-CM | POA: Diagnosis not present

## 2019-10-07 DIAGNOSIS — E7800 Pure hypercholesterolemia, unspecified: Secondary | ICD-10-CM | POA: Diagnosis not present

## 2019-10-07 DIAGNOSIS — I8510 Secondary esophageal varices without bleeding: Secondary | ICD-10-CM | POA: Diagnosis not present

## 2019-10-07 DIAGNOSIS — F329 Major depressive disorder, single episode, unspecified: Secondary | ICD-10-CM | POA: Diagnosis not present

## 2019-10-07 DIAGNOSIS — K7460 Unspecified cirrhosis of liver: Secondary | ICD-10-CM | POA: Diagnosis not present

## 2019-10-07 DIAGNOSIS — Z23 Encounter for immunization: Secondary | ICD-10-CM | POA: Diagnosis not present

## 2019-10-07 DIAGNOSIS — L03116 Cellulitis of left lower limb: Secondary | ICD-10-CM | POA: Diagnosis not present

## 2019-10-07 DIAGNOSIS — R531 Weakness: Secondary | ICD-10-CM | POA: Diagnosis not present

## 2019-10-07 DIAGNOSIS — J810 Acute pulmonary edema: Secondary | ICD-10-CM | POA: Diagnosis not present

## 2019-10-07 DIAGNOSIS — A4189 Other specified sepsis: Secondary | ICD-10-CM | POA: Diagnosis not present

## 2019-10-07 DIAGNOSIS — D509 Iron deficiency anemia, unspecified: Secondary | ICD-10-CM | POA: Diagnosis not present

## 2019-10-07 DIAGNOSIS — E7089 Other disorders of aromatic amino-acid metabolism: Secondary | ICD-10-CM | POA: Diagnosis not present

## 2019-10-07 DIAGNOSIS — N179 Acute kidney failure, unspecified: Secondary | ICD-10-CM | POA: Diagnosis not present

## 2019-10-07 DIAGNOSIS — B372 Candidiasis of skin and nail: Secondary | ICD-10-CM | POA: Diagnosis not present

## 2019-10-07 DIAGNOSIS — L89523 Pressure ulcer of left ankle, stage 3: Secondary | ICD-10-CM | POA: Diagnosis not present

## 2019-10-07 DIAGNOSIS — R5381 Other malaise: Secondary | ICD-10-CM | POA: Diagnosis not present

## 2019-10-07 DIAGNOSIS — M2550 Pain in unspecified joint: Secondary | ICD-10-CM | POA: Diagnosis not present

## 2019-10-07 DIAGNOSIS — I132 Hypertensive heart and chronic kidney disease with heart failure and with stage 5 chronic kidney disease, or end stage renal disease: Secondary | ICD-10-CM | POA: Diagnosis not present

## 2019-10-07 DIAGNOSIS — K219 Gastro-esophageal reflux disease without esophagitis: Secondary | ICD-10-CM | POA: Diagnosis not present

## 2019-10-07 DIAGNOSIS — R944 Abnormal results of kidney function studies: Secondary | ICD-10-CM | POA: Diagnosis not present

## 2019-10-07 DIAGNOSIS — E8770 Fluid overload, unspecified: Secondary | ICD-10-CM | POA: Diagnosis not present

## 2019-10-07 DIAGNOSIS — N186 End stage renal disease: Secondary | ICD-10-CM | POA: Diagnosis not present

## 2019-10-07 DIAGNOSIS — L03317 Cellulitis of buttock: Secondary | ICD-10-CM | POA: Diagnosis not present

## 2019-10-07 DIAGNOSIS — N2581 Secondary hyperparathyroidism of renal origin: Secondary | ICD-10-CM | POA: Diagnosis not present

## 2019-10-07 DIAGNOSIS — B9689 Other specified bacterial agents as the cause of diseases classified elsewhere: Secondary | ICD-10-CM | POA: Diagnosis not present

## 2019-10-07 DIAGNOSIS — L89324 Pressure ulcer of left buttock, stage 4: Secondary | ICD-10-CM | POA: Diagnosis not present

## 2019-10-07 DIAGNOSIS — K7290 Hepatic failure, unspecified without coma: Secondary | ICD-10-CM | POA: Diagnosis not present

## 2019-10-07 DIAGNOSIS — R42 Dizziness and giddiness: Secondary | ICD-10-CM | POA: Diagnosis not present

## 2019-10-07 DIAGNOSIS — D631 Anemia in chronic kidney disease: Secondary | ICD-10-CM | POA: Diagnosis not present

## 2019-10-07 DIAGNOSIS — G061 Intraspinal abscess and granuloma: Secondary | ICD-10-CM | POA: Diagnosis not present

## 2019-10-10 DIAGNOSIS — L03317 Cellulitis of buttock: Secondary | ICD-10-CM | POA: Diagnosis not present

## 2019-10-10 DIAGNOSIS — D509 Iron deficiency anemia, unspecified: Secondary | ICD-10-CM | POA: Diagnosis not present

## 2019-10-10 DIAGNOSIS — N2581 Secondary hyperparathyroidism of renal origin: Secondary | ICD-10-CM | POA: Diagnosis not present

## 2019-10-10 DIAGNOSIS — D631 Anemia in chronic kidney disease: Secondary | ICD-10-CM | POA: Diagnosis not present

## 2019-10-10 DIAGNOSIS — Z23 Encounter for immunization: Secondary | ICD-10-CM | POA: Diagnosis not present

## 2019-10-10 DIAGNOSIS — N179 Acute kidney failure, unspecified: Secondary | ICD-10-CM | POA: Diagnosis not present

## 2019-10-11 DIAGNOSIS — K7460 Unspecified cirrhosis of liver: Secondary | ICD-10-CM | POA: Diagnosis not present

## 2019-10-11 DIAGNOSIS — L270 Generalized skin eruption due to drugs and medicaments taken internally: Secondary | ICD-10-CM | POA: Diagnosis not present

## 2019-10-11 DIAGNOSIS — Z87898 Personal history of other specified conditions: Secondary | ICD-10-CM | POA: Diagnosis not present

## 2019-10-11 DIAGNOSIS — R5381 Other malaise: Secondary | ICD-10-CM | POA: Diagnosis not present

## 2019-10-11 DIAGNOSIS — N179 Acute kidney failure, unspecified: Secondary | ICD-10-CM | POA: Diagnosis not present

## 2019-10-11 DIAGNOSIS — T8131XA Disruption of external operation (surgical) wound, not elsewhere classified, initial encounter: Secondary | ICD-10-CM | POA: Diagnosis not present

## 2019-10-11 DIAGNOSIS — I5032 Chronic diastolic (congestive) heart failure: Secondary | ICD-10-CM | POA: Diagnosis not present

## 2019-10-11 DIAGNOSIS — L89154 Pressure ulcer of sacral region, stage 4: Secondary | ICD-10-CM | POA: Diagnosis not present

## 2019-10-11 DIAGNOSIS — I2510 Atherosclerotic heart disease of native coronary artery without angina pectoris: Secondary | ICD-10-CM | POA: Diagnosis not present

## 2019-10-12 DIAGNOSIS — N179 Acute kidney failure, unspecified: Secondary | ICD-10-CM | POA: Diagnosis not present

## 2019-10-12 DIAGNOSIS — Z23 Encounter for immunization: Secondary | ICD-10-CM | POA: Diagnosis not present

## 2019-10-12 DIAGNOSIS — L03317 Cellulitis of buttock: Secondary | ICD-10-CM | POA: Diagnosis not present

## 2019-10-12 DIAGNOSIS — N2581 Secondary hyperparathyroidism of renal origin: Secondary | ICD-10-CM | POA: Diagnosis not present

## 2019-10-12 DIAGNOSIS — D509 Iron deficiency anemia, unspecified: Secondary | ICD-10-CM | POA: Diagnosis not present

## 2019-10-12 DIAGNOSIS — D631 Anemia in chronic kidney disease: Secondary | ICD-10-CM | POA: Diagnosis not present

## 2019-10-13 DIAGNOSIS — Z87898 Personal history of other specified conditions: Secondary | ICD-10-CM | POA: Diagnosis not present

## 2019-10-13 DIAGNOSIS — T8131XA Disruption of external operation (surgical) wound, not elsewhere classified, initial encounter: Secondary | ICD-10-CM | POA: Diagnosis not present

## 2019-10-13 DIAGNOSIS — R5381 Other malaise: Secondary | ICD-10-CM | POA: Diagnosis not present

## 2019-10-13 DIAGNOSIS — I2510 Atherosclerotic heart disease of native coronary artery without angina pectoris: Secondary | ICD-10-CM | POA: Diagnosis not present

## 2019-10-13 DIAGNOSIS — L270 Generalized skin eruption due to drugs and medicaments taken internally: Secondary | ICD-10-CM | POA: Diagnosis not present

## 2019-10-13 DIAGNOSIS — N179 Acute kidney failure, unspecified: Secondary | ICD-10-CM | POA: Diagnosis not present

## 2019-10-13 DIAGNOSIS — L89154 Pressure ulcer of sacral region, stage 4: Secondary | ICD-10-CM | POA: Diagnosis not present

## 2019-10-13 DIAGNOSIS — I5032 Chronic diastolic (congestive) heart failure: Secondary | ICD-10-CM | POA: Diagnosis not present

## 2019-10-13 DIAGNOSIS — K7460 Unspecified cirrhosis of liver: Secondary | ICD-10-CM | POA: Diagnosis not present

## 2019-10-14 DIAGNOSIS — D509 Iron deficiency anemia, unspecified: Secondary | ICD-10-CM | POA: Diagnosis not present

## 2019-10-14 DIAGNOSIS — L03317 Cellulitis of buttock: Secondary | ICD-10-CM | POA: Diagnosis not present

## 2019-10-14 DIAGNOSIS — N2581 Secondary hyperparathyroidism of renal origin: Secondary | ICD-10-CM | POA: Diagnosis not present

## 2019-10-14 DIAGNOSIS — N179 Acute kidney failure, unspecified: Secondary | ICD-10-CM | POA: Diagnosis not present

## 2019-10-14 DIAGNOSIS — Z23 Encounter for immunization: Secondary | ICD-10-CM | POA: Diagnosis not present

## 2019-10-14 DIAGNOSIS — D631 Anemia in chronic kidney disease: Secondary | ICD-10-CM | POA: Diagnosis not present

## 2019-10-17 DIAGNOSIS — D631 Anemia in chronic kidney disease: Secondary | ICD-10-CM | POA: Diagnosis not present

## 2019-10-17 DIAGNOSIS — Z23 Encounter for immunization: Secondary | ICD-10-CM | POA: Diagnosis not present

## 2019-10-17 DIAGNOSIS — N179 Acute kidney failure, unspecified: Secondary | ICD-10-CM | POA: Diagnosis not present

## 2019-10-17 DIAGNOSIS — D509 Iron deficiency anemia, unspecified: Secondary | ICD-10-CM | POA: Diagnosis not present

## 2019-10-17 DIAGNOSIS — N2581 Secondary hyperparathyroidism of renal origin: Secondary | ICD-10-CM | POA: Diagnosis not present

## 2019-10-17 DIAGNOSIS — L03317 Cellulitis of buttock: Secondary | ICD-10-CM | POA: Diagnosis not present

## 2019-10-18 DIAGNOSIS — L270 Generalized skin eruption due to drugs and medicaments taken internally: Secondary | ICD-10-CM | POA: Diagnosis not present

## 2019-10-18 DIAGNOSIS — N179 Acute kidney failure, unspecified: Secondary | ICD-10-CM | POA: Diagnosis not present

## 2019-10-18 DIAGNOSIS — R5381 Other malaise: Secondary | ICD-10-CM | POA: Diagnosis not present

## 2019-10-18 DIAGNOSIS — I5032 Chronic diastolic (congestive) heart failure: Secondary | ICD-10-CM | POA: Diagnosis not present

## 2019-10-18 DIAGNOSIS — K7460 Unspecified cirrhosis of liver: Secondary | ICD-10-CM | POA: Diagnosis not present

## 2019-10-18 DIAGNOSIS — I2510 Atherosclerotic heart disease of native coronary artery without angina pectoris: Secondary | ICD-10-CM | POA: Diagnosis not present

## 2019-10-18 DIAGNOSIS — L89154 Pressure ulcer of sacral region, stage 4: Secondary | ICD-10-CM | POA: Diagnosis not present

## 2019-10-18 DIAGNOSIS — T8131XA Disruption of external operation (surgical) wound, not elsewhere classified, initial encounter: Secondary | ICD-10-CM | POA: Diagnosis not present

## 2019-10-18 DIAGNOSIS — U071 COVID-19: Secondary | ICD-10-CM | POA: Diagnosis not present

## 2019-10-19 DIAGNOSIS — U071 COVID-19: Secondary | ICD-10-CM | POA: Diagnosis not present

## 2019-10-19 DIAGNOSIS — N2581 Secondary hyperparathyroidism of renal origin: Secondary | ICD-10-CM | POA: Diagnosis not present

## 2019-10-19 DIAGNOSIS — Z23 Encounter for immunization: Secondary | ICD-10-CM | POA: Diagnosis not present

## 2019-10-19 DIAGNOSIS — M48061 Spinal stenosis, lumbar region without neurogenic claudication: Secondary | ICD-10-CM | POA: Diagnosis not present

## 2019-10-19 DIAGNOSIS — N179 Acute kidney failure, unspecified: Secondary | ICD-10-CM | POA: Diagnosis not present

## 2019-10-19 DIAGNOSIS — D631 Anemia in chronic kidney disease: Secondary | ICD-10-CM | POA: Diagnosis not present

## 2019-10-19 DIAGNOSIS — M6281 Muscle weakness (generalized): Secondary | ICD-10-CM | POA: Diagnosis not present

## 2019-10-19 DIAGNOSIS — D509 Iron deficiency anemia, unspecified: Secondary | ICD-10-CM | POA: Diagnosis not present

## 2019-10-19 DIAGNOSIS — L03317 Cellulitis of buttock: Secondary | ICD-10-CM | POA: Diagnosis not present

## 2019-10-19 DIAGNOSIS — R5381 Other malaise: Secondary | ICD-10-CM | POA: Diagnosis not present

## 2019-10-19 DIAGNOSIS — R627 Adult failure to thrive: Secondary | ICD-10-CM | POA: Diagnosis not present

## 2019-10-21 DIAGNOSIS — L03317 Cellulitis of buttock: Secondary | ICD-10-CM | POA: Diagnosis not present

## 2019-10-21 DIAGNOSIS — N179 Acute kidney failure, unspecified: Secondary | ICD-10-CM | POA: Diagnosis not present

## 2019-10-21 DIAGNOSIS — D631 Anemia in chronic kidney disease: Secondary | ICD-10-CM | POA: Diagnosis not present

## 2019-10-21 DIAGNOSIS — N2581 Secondary hyperparathyroidism of renal origin: Secondary | ICD-10-CM | POA: Diagnosis not present

## 2019-10-21 DIAGNOSIS — Z23 Encounter for immunization: Secondary | ICD-10-CM | POA: Diagnosis not present

## 2019-10-21 DIAGNOSIS — D509 Iron deficiency anemia, unspecified: Secondary | ICD-10-CM | POA: Diagnosis not present

## 2019-10-24 DIAGNOSIS — N186 End stage renal disease: Secondary | ICD-10-CM | POA: Diagnosis not present

## 2019-10-24 DIAGNOSIS — N179 Acute kidney failure, unspecified: Secondary | ICD-10-CM | POA: Diagnosis not present

## 2019-10-24 DIAGNOSIS — Z992 Dependence on renal dialysis: Secondary | ICD-10-CM | POA: Diagnosis not present

## 2019-10-24 DIAGNOSIS — L89523 Pressure ulcer of left ankle, stage 3: Secondary | ICD-10-CM | POA: Diagnosis not present

## 2019-10-24 DIAGNOSIS — D509 Iron deficiency anemia, unspecified: Secondary | ICD-10-CM | POA: Diagnosis not present

## 2019-10-24 DIAGNOSIS — L03317 Cellulitis of buttock: Secondary | ICD-10-CM | POA: Diagnosis not present

## 2019-10-24 DIAGNOSIS — L89623 Pressure ulcer of left heel, stage 3: Secondary | ICD-10-CM | POA: Diagnosis not present

## 2019-10-24 DIAGNOSIS — Z23 Encounter for immunization: Secondary | ICD-10-CM | POA: Diagnosis not present

## 2019-10-24 DIAGNOSIS — N2581 Secondary hyperparathyroidism of renal origin: Secondary | ICD-10-CM | POA: Diagnosis not present

## 2019-10-24 DIAGNOSIS — D631 Anemia in chronic kidney disease: Secondary | ICD-10-CM | POA: Diagnosis not present

## 2019-10-24 DIAGNOSIS — T8189XD Other complications of procedures, not elsewhere classified, subsequent encounter: Secondary | ICD-10-CM | POA: Diagnosis not present

## 2019-10-25 DIAGNOSIS — R5381 Other malaise: Secondary | ICD-10-CM | POA: Diagnosis not present

## 2019-10-25 DIAGNOSIS — N179 Acute kidney failure, unspecified: Secondary | ICD-10-CM | POA: Diagnosis not present

## 2019-10-25 DIAGNOSIS — L270 Generalized skin eruption due to drugs and medicaments taken internally: Secondary | ICD-10-CM | POA: Diagnosis not present

## 2019-10-25 DIAGNOSIS — K7460 Unspecified cirrhosis of liver: Secondary | ICD-10-CM | POA: Diagnosis not present

## 2019-10-25 DIAGNOSIS — U071 COVID-19: Secondary | ICD-10-CM | POA: Diagnosis not present

## 2019-10-25 DIAGNOSIS — I2510 Atherosclerotic heart disease of native coronary artery without angina pectoris: Secondary | ICD-10-CM | POA: Diagnosis not present

## 2019-10-25 DIAGNOSIS — T8131XA Disruption of external operation (surgical) wound, not elsewhere classified, initial encounter: Secondary | ICD-10-CM | POA: Diagnosis not present

## 2019-10-25 DIAGNOSIS — I5032 Chronic diastolic (congestive) heart failure: Secondary | ICD-10-CM | POA: Diagnosis not present

## 2019-10-25 DIAGNOSIS — L89154 Pressure ulcer of sacral region, stage 4: Secondary | ICD-10-CM | POA: Diagnosis not present

## 2019-10-26 DIAGNOSIS — I5032 Chronic diastolic (congestive) heart failure: Secondary | ICD-10-CM | POA: Diagnosis not present

## 2019-10-26 DIAGNOSIS — R5381 Other malaise: Secondary | ICD-10-CM | POA: Diagnosis not present

## 2019-10-26 DIAGNOSIS — L89154 Pressure ulcer of sacral region, stage 4: Secondary | ICD-10-CM | POA: Diagnosis not present

## 2019-10-26 DIAGNOSIS — L270 Generalized skin eruption due to drugs and medicaments taken internally: Secondary | ICD-10-CM | POA: Diagnosis not present

## 2019-10-26 DIAGNOSIS — I2510 Atherosclerotic heart disease of native coronary artery without angina pectoris: Secondary | ICD-10-CM | POA: Diagnosis not present

## 2019-10-26 DIAGNOSIS — U071 COVID-19: Secondary | ICD-10-CM | POA: Diagnosis not present

## 2019-10-26 DIAGNOSIS — D509 Iron deficiency anemia, unspecified: Secondary | ICD-10-CM | POA: Diagnosis not present

## 2019-10-26 DIAGNOSIS — T8131XA Disruption of external operation (surgical) wound, not elsewhere classified, initial encounter: Secondary | ICD-10-CM | POA: Diagnosis not present

## 2019-10-26 DIAGNOSIS — K7460 Unspecified cirrhosis of liver: Secondary | ICD-10-CM | POA: Diagnosis not present

## 2019-10-26 DIAGNOSIS — N179 Acute kidney failure, unspecified: Secondary | ICD-10-CM | POA: Diagnosis not present

## 2019-10-26 DIAGNOSIS — D631 Anemia in chronic kidney disease: Secondary | ICD-10-CM | POA: Diagnosis not present

## 2019-10-26 DIAGNOSIS — N2581 Secondary hyperparathyroidism of renal origin: Secondary | ICD-10-CM | POA: Diagnosis not present

## 2019-10-27 DIAGNOSIS — L270 Generalized skin eruption due to drugs and medicaments taken internally: Secondary | ICD-10-CM | POA: Diagnosis not present

## 2019-10-27 DIAGNOSIS — N179 Acute kidney failure, unspecified: Secondary | ICD-10-CM | POA: Diagnosis not present

## 2019-10-27 DIAGNOSIS — I5032 Chronic diastolic (congestive) heart failure: Secondary | ICD-10-CM | POA: Diagnosis not present

## 2019-10-27 DIAGNOSIS — L89154 Pressure ulcer of sacral region, stage 4: Secondary | ICD-10-CM | POA: Diagnosis not present

## 2019-10-27 DIAGNOSIS — T8131XA Disruption of external operation (surgical) wound, not elsewhere classified, initial encounter: Secondary | ICD-10-CM | POA: Diagnosis not present

## 2019-10-27 DIAGNOSIS — I2510 Atherosclerotic heart disease of native coronary artery without angina pectoris: Secondary | ICD-10-CM | POA: Diagnosis not present

## 2019-10-27 DIAGNOSIS — R5381 Other malaise: Secondary | ICD-10-CM | POA: Diagnosis not present

## 2019-10-27 DIAGNOSIS — U071 COVID-19: Secondary | ICD-10-CM | POA: Diagnosis not present

## 2019-10-27 DIAGNOSIS — K7460 Unspecified cirrhosis of liver: Secondary | ICD-10-CM | POA: Diagnosis not present

## 2019-10-28 DIAGNOSIS — N179 Acute kidney failure, unspecified: Secondary | ICD-10-CM | POA: Diagnosis not present

## 2019-10-28 DIAGNOSIS — N2581 Secondary hyperparathyroidism of renal origin: Secondary | ICD-10-CM | POA: Diagnosis not present

## 2019-10-28 DIAGNOSIS — D631 Anemia in chronic kidney disease: Secondary | ICD-10-CM | POA: Diagnosis not present

## 2019-10-28 DIAGNOSIS — D509 Iron deficiency anemia, unspecified: Secondary | ICD-10-CM | POA: Diagnosis not present

## 2019-10-31 DIAGNOSIS — N2581 Secondary hyperparathyroidism of renal origin: Secondary | ICD-10-CM | POA: Diagnosis not present

## 2019-10-31 DIAGNOSIS — N179 Acute kidney failure, unspecified: Secondary | ICD-10-CM | POA: Diagnosis not present

## 2019-10-31 DIAGNOSIS — T8131XA Disruption of external operation (surgical) wound, not elsewhere classified, initial encounter: Secondary | ICD-10-CM | POA: Diagnosis not present

## 2019-10-31 DIAGNOSIS — I2510 Atherosclerotic heart disease of native coronary artery without angina pectoris: Secondary | ICD-10-CM | POA: Diagnosis not present

## 2019-10-31 DIAGNOSIS — D631 Anemia in chronic kidney disease: Secondary | ICD-10-CM | POA: Diagnosis not present

## 2019-10-31 DIAGNOSIS — L89154 Pressure ulcer of sacral region, stage 4: Secondary | ICD-10-CM | POA: Diagnosis not present

## 2019-10-31 DIAGNOSIS — U071 COVID-19: Secondary | ICD-10-CM | POA: Diagnosis not present

## 2019-10-31 DIAGNOSIS — R5381 Other malaise: Secondary | ICD-10-CM | POA: Diagnosis not present

## 2019-10-31 DIAGNOSIS — D509 Iron deficiency anemia, unspecified: Secondary | ICD-10-CM | POA: Diagnosis not present

## 2019-10-31 DIAGNOSIS — L270 Generalized skin eruption due to drugs and medicaments taken internally: Secondary | ICD-10-CM | POA: Diagnosis not present

## 2019-10-31 DIAGNOSIS — K7460 Unspecified cirrhosis of liver: Secondary | ICD-10-CM | POA: Diagnosis not present

## 2019-10-31 DIAGNOSIS — I5032 Chronic diastolic (congestive) heart failure: Secondary | ICD-10-CM | POA: Diagnosis not present

## 2019-11-01 DIAGNOSIS — L270 Generalized skin eruption due to drugs and medicaments taken internally: Secondary | ICD-10-CM | POA: Diagnosis not present

## 2019-11-01 DIAGNOSIS — K7460 Unspecified cirrhosis of liver: Secondary | ICD-10-CM | POA: Diagnosis not present

## 2019-11-01 DIAGNOSIS — R5381 Other malaise: Secondary | ICD-10-CM | POA: Diagnosis not present

## 2019-11-01 DIAGNOSIS — T8131XA Disruption of external operation (surgical) wound, not elsewhere classified, initial encounter: Secondary | ICD-10-CM | POA: Diagnosis not present

## 2019-11-01 DIAGNOSIS — N179 Acute kidney failure, unspecified: Secondary | ICD-10-CM | POA: Diagnosis not present

## 2019-11-01 DIAGNOSIS — U071 COVID-19: Secondary | ICD-10-CM | POA: Diagnosis not present

## 2019-11-01 DIAGNOSIS — I2510 Atherosclerotic heart disease of native coronary artery without angina pectoris: Secondary | ICD-10-CM | POA: Diagnosis not present

## 2019-11-01 DIAGNOSIS — I5032 Chronic diastolic (congestive) heart failure: Secondary | ICD-10-CM | POA: Diagnosis not present

## 2019-11-01 DIAGNOSIS — L89154 Pressure ulcer of sacral region, stage 4: Secondary | ICD-10-CM | POA: Diagnosis not present

## 2019-11-02 DIAGNOSIS — D631 Anemia in chronic kidney disease: Secondary | ICD-10-CM | POA: Diagnosis not present

## 2019-11-02 DIAGNOSIS — N179 Acute kidney failure, unspecified: Secondary | ICD-10-CM | POA: Diagnosis not present

## 2019-11-02 DIAGNOSIS — N2581 Secondary hyperparathyroidism of renal origin: Secondary | ICD-10-CM | POA: Diagnosis not present

## 2019-11-02 DIAGNOSIS — D509 Iron deficiency anemia, unspecified: Secondary | ICD-10-CM | POA: Diagnosis not present

## 2019-11-04 DIAGNOSIS — N179 Acute kidney failure, unspecified: Secondary | ICD-10-CM | POA: Diagnosis not present

## 2019-11-04 DIAGNOSIS — A419 Sepsis, unspecified organism: Secondary | ICD-10-CM | POA: Diagnosis not present

## 2019-11-04 DIAGNOSIS — D509 Iron deficiency anemia, unspecified: Secondary | ICD-10-CM | POA: Diagnosis not present

## 2019-11-07 DIAGNOSIS — D509 Iron deficiency anemia, unspecified: Secondary | ICD-10-CM | POA: Diagnosis not present

## 2019-11-07 DIAGNOSIS — N179 Acute kidney failure, unspecified: Secondary | ICD-10-CM | POA: Diagnosis not present

## 2019-11-07 DIAGNOSIS — A419 Sepsis, unspecified organism: Secondary | ICD-10-CM | POA: Diagnosis not present

## 2019-11-08 DIAGNOSIS — L270 Generalized skin eruption due to drugs and medicaments taken internally: Secondary | ICD-10-CM | POA: Diagnosis not present

## 2019-11-08 DIAGNOSIS — N179 Acute kidney failure, unspecified: Secondary | ICD-10-CM | POA: Diagnosis not present

## 2019-11-08 DIAGNOSIS — N319 Neuromuscular dysfunction of bladder, unspecified: Secondary | ICD-10-CM | POA: Diagnosis not present

## 2019-11-08 DIAGNOSIS — L89154 Pressure ulcer of sacral region, stage 4: Secondary | ICD-10-CM | POA: Diagnosis not present

## 2019-11-08 DIAGNOSIS — R5381 Other malaise: Secondary | ICD-10-CM | POA: Diagnosis not present

## 2019-11-08 DIAGNOSIS — U071 COVID-19: Secondary | ICD-10-CM | POA: Diagnosis not present

## 2019-11-08 DIAGNOSIS — I5032 Chronic diastolic (congestive) heart failure: Secondary | ICD-10-CM | POA: Diagnosis not present

## 2019-11-08 DIAGNOSIS — K7290 Hepatic failure, unspecified without coma: Secondary | ICD-10-CM | POA: Diagnosis not present

## 2019-11-08 DIAGNOSIS — T8131XA Disruption of external operation (surgical) wound, not elsewhere classified, initial encounter: Secondary | ICD-10-CM | POA: Diagnosis not present

## 2019-11-10 DIAGNOSIS — T8131XA Disruption of external operation (surgical) wound, not elsewhere classified, initial encounter: Secondary | ICD-10-CM | POA: Diagnosis not present

## 2019-11-10 DIAGNOSIS — N179 Acute kidney failure, unspecified: Secondary | ICD-10-CM | POA: Diagnosis not present

## 2019-11-10 DIAGNOSIS — A419 Sepsis, unspecified organism: Secondary | ICD-10-CM | POA: Diagnosis not present

## 2019-11-10 DIAGNOSIS — U071 COVID-19: Secondary | ICD-10-CM | POA: Diagnosis not present

## 2019-11-10 DIAGNOSIS — L270 Generalized skin eruption due to drugs and medicaments taken internally: Secondary | ICD-10-CM | POA: Diagnosis not present

## 2019-11-10 DIAGNOSIS — D72829 Elevated white blood cell count, unspecified: Secondary | ICD-10-CM | POA: Diagnosis not present

## 2019-11-10 DIAGNOSIS — L89154 Pressure ulcer of sacral region, stage 4: Secondary | ICD-10-CM | POA: Diagnosis not present

## 2019-11-10 DIAGNOSIS — D509 Iron deficiency anemia, unspecified: Secondary | ICD-10-CM | POA: Diagnosis not present

## 2019-11-10 DIAGNOSIS — K7290 Hepatic failure, unspecified without coma: Secondary | ICD-10-CM | POA: Diagnosis not present

## 2019-11-10 DIAGNOSIS — I5032 Chronic diastolic (congestive) heart failure: Secondary | ICD-10-CM | POA: Diagnosis not present

## 2019-11-10 DIAGNOSIS — N319 Neuromuscular dysfunction of bladder, unspecified: Secondary | ICD-10-CM | POA: Diagnosis not present

## 2019-11-11 DIAGNOSIS — L89154 Pressure ulcer of sacral region, stage 4: Secondary | ICD-10-CM | POA: Diagnosis not present

## 2019-11-11 DIAGNOSIS — F329 Major depressive disorder, single episode, unspecified: Secondary | ICD-10-CM | POA: Diagnosis not present

## 2019-11-11 DIAGNOSIS — K219 Gastro-esophageal reflux disease without esophagitis: Secondary | ICD-10-CM | POA: Diagnosis not present

## 2019-11-11 DIAGNOSIS — U071 COVID-19: Secondary | ICD-10-CM | POA: Diagnosis not present

## 2019-11-11 DIAGNOSIS — I10 Essential (primary) hypertension: Secondary | ICD-10-CM | POA: Diagnosis not present

## 2019-11-11 DIAGNOSIS — I959 Hypotension, unspecified: Secondary | ICD-10-CM | POA: Diagnosis not present

## 2019-11-11 DIAGNOSIS — I5032 Chronic diastolic (congestive) heart failure: Secondary | ICD-10-CM | POA: Diagnosis not present

## 2019-11-11 DIAGNOSIS — L270 Generalized skin eruption due to drugs and medicaments taken internally: Secondary | ICD-10-CM | POA: Diagnosis not present

## 2019-11-11 DIAGNOSIS — R9431 Abnormal electrocardiogram [ECG] [EKG]: Secondary | ICD-10-CM | POA: Diagnosis not present

## 2019-11-11 DIAGNOSIS — Z87891 Personal history of nicotine dependence: Secondary | ICD-10-CM | POA: Diagnosis not present

## 2019-11-11 DIAGNOSIS — D72829 Elevated white blood cell count, unspecified: Secondary | ICD-10-CM | POA: Diagnosis not present

## 2019-11-11 DIAGNOSIS — Z79899 Other long term (current) drug therapy: Secondary | ICD-10-CM | POA: Diagnosis not present

## 2019-11-11 DIAGNOSIS — T8131XA Disruption of external operation (surgical) wound, not elsewhere classified, initial encounter: Secondary | ICD-10-CM | POA: Diagnosis not present

## 2019-11-11 DIAGNOSIS — K7290 Hepatic failure, unspecified without coma: Secondary | ICD-10-CM | POA: Diagnosis not present

## 2019-11-11 DIAGNOSIS — Z992 Dependence on renal dialysis: Secondary | ICD-10-CM | POA: Diagnosis not present

## 2019-11-11 DIAGNOSIS — N179 Acute kidney failure, unspecified: Secondary | ICD-10-CM | POA: Diagnosis not present

## 2019-11-11 DIAGNOSIS — R42 Dizziness and giddiness: Secondary | ICD-10-CM | POA: Diagnosis not present

## 2019-11-11 DIAGNOSIS — N319 Neuromuscular dysfunction of bladder, unspecified: Secondary | ICD-10-CM | POA: Diagnosis not present

## 2019-11-12 DIAGNOSIS — N179 Acute kidney failure, unspecified: Secondary | ICD-10-CM | POA: Diagnosis not present

## 2019-11-12 DIAGNOSIS — A419 Sepsis, unspecified organism: Secondary | ICD-10-CM | POA: Diagnosis not present

## 2019-11-12 DIAGNOSIS — D509 Iron deficiency anemia, unspecified: Secondary | ICD-10-CM | POA: Diagnosis not present

## 2019-11-12 DIAGNOSIS — D649 Anemia, unspecified: Secondary | ICD-10-CM | POA: Diagnosis not present

## 2019-11-12 DIAGNOSIS — A4189 Other specified sepsis: Secondary | ICD-10-CM | POA: Diagnosis not present

## 2019-11-14 DIAGNOSIS — K7290 Hepatic failure, unspecified without coma: Secondary | ICD-10-CM | POA: Diagnosis not present

## 2019-11-14 DIAGNOSIS — N179 Acute kidney failure, unspecified: Secondary | ICD-10-CM | POA: Diagnosis not present

## 2019-11-14 DIAGNOSIS — D631 Anemia in chronic kidney disease: Secondary | ICD-10-CM | POA: Diagnosis not present

## 2019-11-14 DIAGNOSIS — D72829 Elevated white blood cell count, unspecified: Secondary | ICD-10-CM | POA: Diagnosis not present

## 2019-11-14 DIAGNOSIS — U071 COVID-19: Secondary | ICD-10-CM | POA: Diagnosis not present

## 2019-11-14 DIAGNOSIS — A419 Sepsis, unspecified organism: Secondary | ICD-10-CM | POA: Diagnosis not present

## 2019-11-14 DIAGNOSIS — T8131XA Disruption of external operation (surgical) wound, not elsewhere classified, initial encounter: Secondary | ICD-10-CM | POA: Diagnosis not present

## 2019-11-14 DIAGNOSIS — L270 Generalized skin eruption due to drugs and medicaments taken internally: Secondary | ICD-10-CM | POA: Diagnosis not present

## 2019-11-14 DIAGNOSIS — N319 Neuromuscular dysfunction of bladder, unspecified: Secondary | ICD-10-CM | POA: Diagnosis not present

## 2019-11-14 DIAGNOSIS — N390 Urinary tract infection, site not specified: Secondary | ICD-10-CM | POA: Diagnosis not present

## 2019-11-14 DIAGNOSIS — L89154 Pressure ulcer of sacral region, stage 4: Secondary | ICD-10-CM | POA: Diagnosis not present

## 2019-11-14 DIAGNOSIS — D509 Iron deficiency anemia, unspecified: Secondary | ICD-10-CM | POA: Diagnosis not present

## 2019-11-15 DIAGNOSIS — L0231 Cutaneous abscess of buttock: Secondary | ICD-10-CM | POA: Diagnosis not present

## 2019-11-15 DIAGNOSIS — L89154 Pressure ulcer of sacral region, stage 4: Secondary | ICD-10-CM | POA: Diagnosis not present

## 2019-11-15 DIAGNOSIS — T8130XD Disruption of wound, unspecified, subsequent encounter: Secondary | ICD-10-CM | POA: Diagnosis not present

## 2019-11-15 DIAGNOSIS — Z9889 Other specified postprocedural states: Secondary | ICD-10-CM | POA: Diagnosis not present

## 2019-11-15 DIAGNOSIS — M4628 Osteomyelitis of vertebra, sacral and sacrococcygeal region: Secondary | ICD-10-CM | POA: Diagnosis not present

## 2019-11-15 DIAGNOSIS — R531 Weakness: Secondary | ICD-10-CM | POA: Diagnosis not present

## 2019-11-15 DIAGNOSIS — B9689 Other specified bacterial agents as the cause of diseases classified elsewhere: Secondary | ICD-10-CM | POA: Diagnosis not present

## 2019-11-16 DIAGNOSIS — L89154 Pressure ulcer of sacral region, stage 4: Secondary | ICD-10-CM | POA: Diagnosis not present

## 2019-11-16 DIAGNOSIS — U071 COVID-19: Secondary | ICD-10-CM | POA: Diagnosis not present

## 2019-11-16 DIAGNOSIS — D509 Iron deficiency anemia, unspecified: Secondary | ICD-10-CM | POA: Diagnosis not present

## 2019-11-16 DIAGNOSIS — D631 Anemia in chronic kidney disease: Secondary | ICD-10-CM | POA: Diagnosis not present

## 2019-11-16 DIAGNOSIS — K7290 Hepatic failure, unspecified without coma: Secondary | ICD-10-CM | POA: Diagnosis not present

## 2019-11-16 DIAGNOSIS — M6281 Muscle weakness (generalized): Secondary | ICD-10-CM | POA: Diagnosis not present

## 2019-11-16 DIAGNOSIS — R5381 Other malaise: Secondary | ICD-10-CM | POA: Diagnosis not present

## 2019-11-16 DIAGNOSIS — N319 Neuromuscular dysfunction of bladder, unspecified: Secondary | ICD-10-CM | POA: Diagnosis not present

## 2019-11-16 DIAGNOSIS — N390 Urinary tract infection, site not specified: Secondary | ICD-10-CM | POA: Diagnosis not present

## 2019-11-16 DIAGNOSIS — L270 Generalized skin eruption due to drugs and medicaments taken internally: Secondary | ICD-10-CM | POA: Diagnosis not present

## 2019-11-16 DIAGNOSIS — N179 Acute kidney failure, unspecified: Secondary | ICD-10-CM | POA: Diagnosis not present

## 2019-11-16 DIAGNOSIS — B372 Candidiasis of skin and nail: Secondary | ICD-10-CM | POA: Diagnosis not present

## 2019-11-16 DIAGNOSIS — D72829 Elevated white blood cell count, unspecified: Secondary | ICD-10-CM | POA: Diagnosis not present

## 2019-11-16 DIAGNOSIS — M48061 Spinal stenosis, lumbar region without neurogenic claudication: Secondary | ICD-10-CM | POA: Diagnosis not present

## 2019-11-16 DIAGNOSIS — A419 Sepsis, unspecified organism: Secondary | ICD-10-CM | POA: Diagnosis not present

## 2019-11-16 DIAGNOSIS — R627 Adult failure to thrive: Secondary | ICD-10-CM | POA: Diagnosis not present

## 2019-11-19 DIAGNOSIS — N390 Urinary tract infection, site not specified: Secondary | ICD-10-CM | POA: Diagnosis not present

## 2019-11-19 DIAGNOSIS — D631 Anemia in chronic kidney disease: Secondary | ICD-10-CM | POA: Diagnosis not present

## 2019-11-19 DIAGNOSIS — A419 Sepsis, unspecified organism: Secondary | ICD-10-CM | POA: Diagnosis not present

## 2019-11-19 DIAGNOSIS — D509 Iron deficiency anemia, unspecified: Secondary | ICD-10-CM | POA: Diagnosis not present

## 2019-11-19 DIAGNOSIS — N179 Acute kidney failure, unspecified: Secondary | ICD-10-CM | POA: Diagnosis not present

## 2019-11-21 DIAGNOSIS — N319 Neuromuscular dysfunction of bladder, unspecified: Secondary | ICD-10-CM | POA: Diagnosis not present

## 2019-11-21 DIAGNOSIS — D509 Iron deficiency anemia, unspecified: Secondary | ICD-10-CM | POA: Diagnosis not present

## 2019-11-21 DIAGNOSIS — L89523 Pressure ulcer of left ankle, stage 3: Secondary | ICD-10-CM | POA: Diagnosis not present

## 2019-11-21 DIAGNOSIS — N179 Acute kidney failure, unspecified: Secondary | ICD-10-CM | POA: Diagnosis not present

## 2019-11-21 DIAGNOSIS — D631 Anemia in chronic kidney disease: Secondary | ICD-10-CM | POA: Diagnosis not present

## 2019-11-21 DIAGNOSIS — L89154 Pressure ulcer of sacral region, stage 4: Secondary | ICD-10-CM | POA: Diagnosis not present

## 2019-11-21 DIAGNOSIS — U071 COVID-19: Secondary | ICD-10-CM | POA: Diagnosis not present

## 2019-11-21 DIAGNOSIS — L89613 Pressure ulcer of right heel, stage 3: Secondary | ICD-10-CM | POA: Diagnosis not present

## 2019-11-21 DIAGNOSIS — M533 Sacrococcygeal disorders, not elsewhere classified: Secondary | ICD-10-CM | POA: Diagnosis not present

## 2019-11-21 DIAGNOSIS — L89623 Pressure ulcer of left heel, stage 3: Secondary | ICD-10-CM | POA: Diagnosis not present

## 2019-11-21 DIAGNOSIS — D72829 Elevated white blood cell count, unspecified: Secondary | ICD-10-CM | POA: Diagnosis not present

## 2019-11-21 DIAGNOSIS — N390 Urinary tract infection, site not specified: Secondary | ICD-10-CM | POA: Diagnosis not present

## 2019-11-21 DIAGNOSIS — L89324 Pressure ulcer of left buttock, stage 4: Secondary | ICD-10-CM | POA: Diagnosis not present

## 2019-11-21 DIAGNOSIS — A419 Sepsis, unspecified organism: Secondary | ICD-10-CM | POA: Diagnosis not present

## 2019-11-21 DIAGNOSIS — B372 Candidiasis of skin and nail: Secondary | ICD-10-CM | POA: Diagnosis not present

## 2019-11-21 DIAGNOSIS — K7290 Hepatic failure, unspecified without coma: Secondary | ICD-10-CM | POA: Diagnosis not present

## 2019-11-22 DIAGNOSIS — D72829 Elevated white blood cell count, unspecified: Secondary | ICD-10-CM | POA: Diagnosis not present

## 2019-11-22 DIAGNOSIS — B372 Candidiasis of skin and nail: Secondary | ICD-10-CM | POA: Diagnosis not present

## 2019-11-22 DIAGNOSIS — M533 Sacrococcygeal disorders, not elsewhere classified: Secondary | ICD-10-CM | POA: Diagnosis not present

## 2019-11-22 DIAGNOSIS — L89154 Pressure ulcer of sacral region, stage 4: Secondary | ICD-10-CM | POA: Diagnosis not present

## 2019-11-22 DIAGNOSIS — N390 Urinary tract infection, site not specified: Secondary | ICD-10-CM | POA: Diagnosis not present

## 2019-11-22 DIAGNOSIS — R7881 Bacteremia: Secondary | ICD-10-CM | POA: Diagnosis not present

## 2019-11-22 DIAGNOSIS — N319 Neuromuscular dysfunction of bladder, unspecified: Secondary | ICD-10-CM | POA: Diagnosis not present

## 2019-11-22 DIAGNOSIS — U071 COVID-19: Secondary | ICD-10-CM | POA: Diagnosis not present

## 2019-11-22 DIAGNOSIS — K7290 Hepatic failure, unspecified without coma: Secondary | ICD-10-CM | POA: Diagnosis not present

## 2019-11-23 DIAGNOSIS — N179 Acute kidney failure, unspecified: Secondary | ICD-10-CM | POA: Diagnosis not present

## 2019-11-23 DIAGNOSIS — L89154 Pressure ulcer of sacral region, stage 4: Secondary | ICD-10-CM | POA: Diagnosis not present

## 2019-11-23 DIAGNOSIS — D631 Anemia in chronic kidney disease: Secondary | ICD-10-CM | POA: Diagnosis not present

## 2019-11-23 DIAGNOSIS — K7290 Hepatic failure, unspecified without coma: Secondary | ICD-10-CM | POA: Diagnosis not present

## 2019-11-23 DIAGNOSIS — M533 Sacrococcygeal disorders, not elsewhere classified: Secondary | ICD-10-CM | POA: Diagnosis not present

## 2019-11-23 DIAGNOSIS — D509 Iron deficiency anemia, unspecified: Secondary | ICD-10-CM | POA: Diagnosis not present

## 2019-11-23 DIAGNOSIS — B372 Candidiasis of skin and nail: Secondary | ICD-10-CM | POA: Diagnosis not present

## 2019-11-23 DIAGNOSIS — A419 Sepsis, unspecified organism: Secondary | ICD-10-CM | POA: Diagnosis not present

## 2019-11-23 DIAGNOSIS — D72829 Elevated white blood cell count, unspecified: Secondary | ICD-10-CM | POA: Diagnosis not present

## 2019-11-23 DIAGNOSIS — U071 COVID-19: Secondary | ICD-10-CM | POA: Diagnosis not present

## 2019-11-23 DIAGNOSIS — R7881 Bacteremia: Secondary | ICD-10-CM | POA: Diagnosis not present

## 2019-11-23 DIAGNOSIS — N319 Neuromuscular dysfunction of bladder, unspecified: Secondary | ICD-10-CM | POA: Diagnosis not present

## 2019-11-23 DIAGNOSIS — N390 Urinary tract infection, site not specified: Secondary | ICD-10-CM | POA: Diagnosis not present

## 2019-11-24 DIAGNOSIS — R7881 Bacteremia: Secondary | ICD-10-CM | POA: Diagnosis not present

## 2019-11-24 DIAGNOSIS — N390 Urinary tract infection, site not specified: Secondary | ICD-10-CM | POA: Diagnosis not present

## 2019-11-24 DIAGNOSIS — M533 Sacrococcygeal disorders, not elsewhere classified: Secondary | ICD-10-CM | POA: Diagnosis not present

## 2019-11-24 DIAGNOSIS — L89154 Pressure ulcer of sacral region, stage 4: Secondary | ICD-10-CM | POA: Diagnosis not present

## 2019-11-24 DIAGNOSIS — U071 COVID-19: Secondary | ICD-10-CM | POA: Diagnosis not present

## 2019-11-24 DIAGNOSIS — B372 Candidiasis of skin and nail: Secondary | ICD-10-CM | POA: Diagnosis not present

## 2019-11-24 DIAGNOSIS — N186 End stage renal disease: Secondary | ICD-10-CM | POA: Diagnosis not present

## 2019-11-24 DIAGNOSIS — K7290 Hepatic failure, unspecified without coma: Secondary | ICD-10-CM | POA: Diagnosis not present

## 2019-11-24 DIAGNOSIS — D72829 Elevated white blood cell count, unspecified: Secondary | ICD-10-CM | POA: Diagnosis not present

## 2019-11-24 DIAGNOSIS — Z992 Dependence on renal dialysis: Secondary | ICD-10-CM | POA: Diagnosis not present

## 2019-11-24 DIAGNOSIS — N319 Neuromuscular dysfunction of bladder, unspecified: Secondary | ICD-10-CM | POA: Diagnosis not present

## 2019-11-25 DIAGNOSIS — D509 Iron deficiency anemia, unspecified: Secondary | ICD-10-CM | POA: Diagnosis not present

## 2019-11-25 DIAGNOSIS — N179 Acute kidney failure, unspecified: Secondary | ICD-10-CM | POA: Diagnosis not present

## 2019-11-25 DIAGNOSIS — N2581 Secondary hyperparathyroidism of renal origin: Secondary | ICD-10-CM | POA: Diagnosis not present

## 2019-11-28 DIAGNOSIS — D72829 Elevated white blood cell count, unspecified: Secondary | ICD-10-CM | POA: Diagnosis not present

## 2019-11-28 DIAGNOSIS — L89154 Pressure ulcer of sacral region, stage 4: Secondary | ICD-10-CM | POA: Diagnosis not present

## 2019-11-28 DIAGNOSIS — B372 Candidiasis of skin and nail: Secondary | ICD-10-CM | POA: Diagnosis not present

## 2019-11-28 DIAGNOSIS — N179 Acute kidney failure, unspecified: Secondary | ICD-10-CM | POA: Diagnosis not present

## 2019-11-28 DIAGNOSIS — U071 COVID-19: Secondary | ICD-10-CM | POA: Diagnosis not present

## 2019-11-28 DIAGNOSIS — M533 Sacrococcygeal disorders, not elsewhere classified: Secondary | ICD-10-CM | POA: Diagnosis not present

## 2019-11-28 DIAGNOSIS — N2581 Secondary hyperparathyroidism of renal origin: Secondary | ICD-10-CM | POA: Diagnosis not present

## 2019-11-28 DIAGNOSIS — R7881 Bacteremia: Secondary | ICD-10-CM | POA: Diagnosis not present

## 2019-11-28 DIAGNOSIS — N319 Neuromuscular dysfunction of bladder, unspecified: Secondary | ICD-10-CM | POA: Diagnosis not present

## 2019-11-28 DIAGNOSIS — K7290 Hepatic failure, unspecified without coma: Secondary | ICD-10-CM | POA: Diagnosis not present

## 2019-11-28 DIAGNOSIS — D509 Iron deficiency anemia, unspecified: Secondary | ICD-10-CM | POA: Diagnosis not present

## 2019-11-28 DIAGNOSIS — N390 Urinary tract infection, site not specified: Secondary | ICD-10-CM | POA: Diagnosis not present

## 2019-11-29 DIAGNOSIS — M533 Sacrococcygeal disorders, not elsewhere classified: Secondary | ICD-10-CM | POA: Diagnosis not present

## 2019-11-29 DIAGNOSIS — B372 Candidiasis of skin and nail: Secondary | ICD-10-CM | POA: Diagnosis not present

## 2019-11-29 DIAGNOSIS — K7290 Hepatic failure, unspecified without coma: Secondary | ICD-10-CM | POA: Diagnosis not present

## 2019-11-29 DIAGNOSIS — N319 Neuromuscular dysfunction of bladder, unspecified: Secondary | ICD-10-CM | POA: Diagnosis not present

## 2019-11-29 DIAGNOSIS — N390 Urinary tract infection, site not specified: Secondary | ICD-10-CM | POA: Diagnosis not present

## 2019-11-29 DIAGNOSIS — D72829 Elevated white blood cell count, unspecified: Secondary | ICD-10-CM | POA: Diagnosis not present

## 2019-11-29 DIAGNOSIS — L89154 Pressure ulcer of sacral region, stage 4: Secondary | ICD-10-CM | POA: Diagnosis not present

## 2019-11-29 DIAGNOSIS — U071 COVID-19: Secondary | ICD-10-CM | POA: Diagnosis not present

## 2019-11-29 DIAGNOSIS — R7881 Bacteremia: Secondary | ICD-10-CM | POA: Diagnosis not present

## 2019-11-30 DIAGNOSIS — N390 Urinary tract infection, site not specified: Secondary | ICD-10-CM | POA: Diagnosis not present

## 2019-11-30 DIAGNOSIS — N319 Neuromuscular dysfunction of bladder, unspecified: Secondary | ICD-10-CM | POA: Diagnosis not present

## 2019-11-30 DIAGNOSIS — D72829 Elevated white blood cell count, unspecified: Secondary | ICD-10-CM | POA: Diagnosis not present

## 2019-11-30 DIAGNOSIS — U071 COVID-19: Secondary | ICD-10-CM | POA: Diagnosis not present

## 2019-11-30 DIAGNOSIS — L89154 Pressure ulcer of sacral region, stage 4: Secondary | ICD-10-CM | POA: Diagnosis not present

## 2019-11-30 DIAGNOSIS — M533 Sacrococcygeal disorders, not elsewhere classified: Secondary | ICD-10-CM | POA: Diagnosis not present

## 2019-11-30 DIAGNOSIS — K7290 Hepatic failure, unspecified without coma: Secondary | ICD-10-CM | POA: Diagnosis not present

## 2019-11-30 DIAGNOSIS — R7881 Bacteremia: Secondary | ICD-10-CM | POA: Diagnosis not present

## 2019-11-30 DIAGNOSIS — B372 Candidiasis of skin and nail: Secondary | ICD-10-CM | POA: Diagnosis not present

## 2019-12-01 DIAGNOSIS — U071 COVID-19: Secondary | ICD-10-CM | POA: Diagnosis not present

## 2019-12-01 DIAGNOSIS — B372 Candidiasis of skin and nail: Secondary | ICD-10-CM | POA: Diagnosis not present

## 2019-12-01 DIAGNOSIS — R7881 Bacteremia: Secondary | ICD-10-CM | POA: Diagnosis not present

## 2019-12-01 DIAGNOSIS — N319 Neuromuscular dysfunction of bladder, unspecified: Secondary | ICD-10-CM | POA: Diagnosis not present

## 2019-12-01 DIAGNOSIS — N390 Urinary tract infection, site not specified: Secondary | ICD-10-CM | POA: Diagnosis not present

## 2019-12-01 DIAGNOSIS — K7290 Hepatic failure, unspecified without coma: Secondary | ICD-10-CM | POA: Diagnosis not present

## 2019-12-01 DIAGNOSIS — L89154 Pressure ulcer of sacral region, stage 4: Secondary | ICD-10-CM | POA: Diagnosis not present

## 2019-12-01 DIAGNOSIS — D72829 Elevated white blood cell count, unspecified: Secondary | ICD-10-CM | POA: Diagnosis not present

## 2019-12-01 DIAGNOSIS — M533 Sacrococcygeal disorders, not elsewhere classified: Secondary | ICD-10-CM | POA: Diagnosis not present

## 2019-12-02 DIAGNOSIS — N179 Acute kidney failure, unspecified: Secondary | ICD-10-CM | POA: Diagnosis not present

## 2019-12-02 DIAGNOSIS — D509 Iron deficiency anemia, unspecified: Secondary | ICD-10-CM | POA: Diagnosis not present

## 2019-12-02 DIAGNOSIS — N2581 Secondary hyperparathyroidism of renal origin: Secondary | ICD-10-CM | POA: Diagnosis not present

## 2019-12-05 DIAGNOSIS — R7881 Bacteremia: Secondary | ICD-10-CM | POA: Diagnosis not present

## 2019-12-05 DIAGNOSIS — N390 Urinary tract infection, site not specified: Secondary | ICD-10-CM | POA: Diagnosis not present

## 2019-12-05 DIAGNOSIS — L89154 Pressure ulcer of sacral region, stage 4: Secondary | ICD-10-CM | POA: Diagnosis not present

## 2019-12-05 DIAGNOSIS — K7290 Hepatic failure, unspecified without coma: Secondary | ICD-10-CM | POA: Diagnosis not present

## 2019-12-05 DIAGNOSIS — U071 COVID-19: Secondary | ICD-10-CM | POA: Diagnosis not present

## 2019-12-05 DIAGNOSIS — N319 Neuromuscular dysfunction of bladder, unspecified: Secondary | ICD-10-CM | POA: Diagnosis not present

## 2019-12-05 DIAGNOSIS — B372 Candidiasis of skin and nail: Secondary | ICD-10-CM | POA: Diagnosis not present

## 2019-12-05 DIAGNOSIS — M533 Sacrococcygeal disorders, not elsewhere classified: Secondary | ICD-10-CM | POA: Diagnosis not present

## 2019-12-05 DIAGNOSIS — N179 Acute kidney failure, unspecified: Secondary | ICD-10-CM | POA: Diagnosis not present

## 2019-12-05 DIAGNOSIS — D72829 Elevated white blood cell count, unspecified: Secondary | ICD-10-CM | POA: Diagnosis not present

## 2019-12-06 DIAGNOSIS — N179 Acute kidney failure, unspecified: Secondary | ICD-10-CM | POA: Diagnosis not present

## 2019-12-06 DIAGNOSIS — I10 Essential (primary) hypertension: Secondary | ICD-10-CM | POA: Diagnosis not present

## 2019-12-06 DIAGNOSIS — R5381 Other malaise: Secondary | ICD-10-CM | POA: Diagnosis not present

## 2019-12-06 DIAGNOSIS — E871 Hypo-osmolality and hyponatremia: Secondary | ICD-10-CM | POA: Diagnosis not present

## 2019-12-06 DIAGNOSIS — B372 Candidiasis of skin and nail: Secondary | ICD-10-CM | POA: Diagnosis not present

## 2019-12-06 DIAGNOSIS — R29898 Other symptoms and signs involving the musculoskeletal system: Secondary | ICD-10-CM | POA: Diagnosis not present

## 2019-12-06 DIAGNOSIS — J810 Acute pulmonary edema: Secondary | ICD-10-CM | POA: Diagnosis not present

## 2019-12-06 DIAGNOSIS — R4182 Altered mental status, unspecified: Secondary | ICD-10-CM | POA: Diagnosis not present

## 2019-12-06 DIAGNOSIS — N186 End stage renal disease: Secondary | ICD-10-CM | POA: Diagnosis not present

## 2019-12-06 DIAGNOSIS — J811 Chronic pulmonary edema: Secondary | ICD-10-CM | POA: Diagnosis not present

## 2019-12-06 DIAGNOSIS — T85631A Leakage of intraperitoneal dialysis catheter, initial encounter: Secondary | ICD-10-CM | POA: Diagnosis not present

## 2019-12-06 DIAGNOSIS — M533 Sacrococcygeal disorders, not elsewhere classified: Secondary | ICD-10-CM | POA: Diagnosis not present

## 2019-12-06 DIAGNOSIS — R410 Disorientation, unspecified: Secondary | ICD-10-CM | POA: Diagnosis not present

## 2019-12-06 DIAGNOSIS — D631 Anemia in chronic kidney disease: Secondary | ICD-10-CM | POA: Diagnosis not present

## 2019-12-06 DIAGNOSIS — I5033 Acute on chronic diastolic (congestive) heart failure: Secondary | ICD-10-CM | POA: Diagnosis not present

## 2019-12-06 DIAGNOSIS — R7881 Bacteremia: Secondary | ICD-10-CM | POA: Diagnosis not present

## 2019-12-06 DIAGNOSIS — G9009 Other idiopathic peripheral autonomic neuropathy: Secondary | ICD-10-CM | POA: Diagnosis not present

## 2019-12-06 DIAGNOSIS — R9431 Abnormal electrocardiogram [ECG] [EKG]: Secondary | ICD-10-CM | POA: Diagnosis not present

## 2019-12-06 DIAGNOSIS — U071 COVID-19: Secondary | ICD-10-CM | POA: Diagnosis not present

## 2019-12-06 DIAGNOSIS — I959 Hypotension, unspecified: Secondary | ICD-10-CM | POA: Diagnosis not present

## 2019-12-06 DIAGNOSIS — I132 Hypertensive heart and chronic kidney disease with heart failure and with stage 5 chronic kidney disease, or end stage renal disease: Secondary | ICD-10-CM | POA: Diagnosis not present

## 2019-12-06 DIAGNOSIS — K7469 Other cirrhosis of liver: Secondary | ICD-10-CM | POA: Diagnosis not present

## 2019-12-06 DIAGNOSIS — M7989 Other specified soft tissue disorders: Secondary | ICD-10-CM | POA: Diagnosis not present

## 2019-12-06 DIAGNOSIS — M8668 Other chronic osteomyelitis, other site: Secondary | ICD-10-CM | POA: Diagnosis not present

## 2019-12-06 DIAGNOSIS — R627 Adult failure to thrive: Secondary | ICD-10-CM | POA: Diagnosis not present

## 2019-12-06 DIAGNOSIS — I5030 Unspecified diastolic (congestive) heart failure: Secondary | ICD-10-CM | POA: Diagnosis not present

## 2019-12-06 DIAGNOSIS — R0902 Hypoxemia: Secondary | ICD-10-CM | POA: Diagnosis not present

## 2019-12-06 DIAGNOSIS — N189 Chronic kidney disease, unspecified: Secondary | ICD-10-CM | POA: Diagnosis not present

## 2019-12-06 DIAGNOSIS — E875 Hyperkalemia: Secondary | ICD-10-CM | POA: Diagnosis not present

## 2019-12-06 DIAGNOSIS — L89510 Pressure ulcer of right ankle, unstageable: Secondary | ICD-10-CM | POA: Diagnosis not present

## 2019-12-06 DIAGNOSIS — R609 Edema, unspecified: Secondary | ICD-10-CM | POA: Diagnosis not present

## 2019-12-06 DIAGNOSIS — L89523 Pressure ulcer of left ankle, stage 3: Secondary | ICD-10-CM | POA: Diagnosis not present

## 2019-12-06 DIAGNOSIS — R300 Dysuria: Secondary | ICD-10-CM | POA: Diagnosis not present

## 2019-12-06 DIAGNOSIS — R319 Hematuria, unspecified: Secondary | ICD-10-CM | POA: Diagnosis not present

## 2019-12-06 DIAGNOSIS — L03115 Cellulitis of right lower limb: Secondary | ICD-10-CM | POA: Diagnosis not present

## 2019-12-06 DIAGNOSIS — D72828 Other elevated white blood cell count: Secondary | ICD-10-CM | POA: Diagnosis not present

## 2019-12-06 DIAGNOSIS — M6281 Muscle weakness (generalized): Secondary | ICD-10-CM | POA: Diagnosis not present

## 2019-12-06 DIAGNOSIS — R112 Nausea with vomiting, unspecified: Secondary | ICD-10-CM | POA: Diagnosis not present

## 2019-12-06 DIAGNOSIS — L89154 Pressure ulcer of sacral region, stage 4: Secondary | ICD-10-CM | POA: Diagnosis not present

## 2019-12-06 DIAGNOSIS — R000 Tachycardia, unspecified: Secondary | ICD-10-CM | POA: Diagnosis not present

## 2019-12-06 DIAGNOSIS — K7290 Hepatic failure, unspecified without coma: Secondary | ICD-10-CM | POA: Diagnosis not present

## 2019-12-06 DIAGNOSIS — N319 Neuromuscular dysfunction of bladder, unspecified: Secondary | ICD-10-CM | POA: Diagnosis not present

## 2019-12-06 DIAGNOSIS — T8131XD Disruption of external operation (surgical) wound, not elsewhere classified, subsequent encounter: Secondary | ICD-10-CM | POA: Diagnosis not present

## 2019-12-06 DIAGNOSIS — D72829 Elevated white blood cell count, unspecified: Secondary | ICD-10-CM | POA: Diagnosis not present

## 2019-12-06 DIAGNOSIS — Z992 Dependence on renal dialysis: Secondary | ICD-10-CM | POA: Diagnosis not present

## 2019-12-06 DIAGNOSIS — N390 Urinary tract infection, site not specified: Secondary | ICD-10-CM | POA: Diagnosis not present

## 2019-12-06 DIAGNOSIS — G9341 Metabolic encephalopathy: Secondary | ICD-10-CM | POA: Diagnosis not present

## 2019-12-06 DIAGNOSIS — L03116 Cellulitis of left lower limb: Secondary | ICD-10-CM | POA: Diagnosis not present

## 2019-12-06 DIAGNOSIS — L0231 Cutaneous abscess of buttock: Secondary | ICD-10-CM | POA: Diagnosis not present

## 2019-12-06 DIAGNOSIS — L89314 Pressure ulcer of right buttock, stage 4: Secondary | ICD-10-CM | POA: Diagnosis not present

## 2019-12-06 DIAGNOSIS — E569 Vitamin deficiency, unspecified: Secondary | ICD-10-CM | POA: Diagnosis not present

## 2019-12-06 DIAGNOSIS — B9620 Unspecified Escherichia coli [E. coli] as the cause of diseases classified elsewhere: Secondary | ICD-10-CM | POA: Diagnosis not present

## 2019-12-06 DIAGNOSIS — M4628 Osteomyelitis of vertebra, sacral and sacrococcygeal region: Secondary | ICD-10-CM | POA: Diagnosis not present

## 2019-12-06 DIAGNOSIS — M48061 Spinal stenosis, lumbar region without neurogenic claudication: Secondary | ICD-10-CM | POA: Diagnosis not present

## 2019-12-06 DIAGNOSIS — Z743 Need for continuous supervision: Secondary | ICD-10-CM | POA: Diagnosis not present

## 2019-12-14 DIAGNOSIS — E569 Vitamin deficiency, unspecified: Secondary | ICD-10-CM | POA: Diagnosis not present

## 2019-12-14 DIAGNOSIS — N186 End stage renal disease: Secondary | ICD-10-CM | POA: Diagnosis not present

## 2019-12-14 DIAGNOSIS — Z992 Dependence on renal dialysis: Secondary | ICD-10-CM | POA: Diagnosis not present

## 2019-12-14 DIAGNOSIS — M4628 Osteomyelitis of vertebra, sacral and sacrococcygeal region: Secondary | ICD-10-CM | POA: Diagnosis not present

## 2019-12-14 DIAGNOSIS — M6281 Muscle weakness (generalized): Secondary | ICD-10-CM | POA: Diagnosis not present

## 2019-12-14 DIAGNOSIS — D72828 Other elevated white blood cell count: Secondary | ICD-10-CM | POA: Diagnosis not present

## 2019-12-14 DIAGNOSIS — K7290 Hepatic failure, unspecified without coma: Secondary | ICD-10-CM | POA: Diagnosis not present

## 2019-12-14 DIAGNOSIS — K7460 Unspecified cirrhosis of liver: Secondary | ICD-10-CM | POA: Diagnosis not present

## 2019-12-14 DIAGNOSIS — R29898 Other symptoms and signs involving the musculoskeletal system: Secondary | ICD-10-CM | POA: Diagnosis not present

## 2019-12-14 DIAGNOSIS — G9341 Metabolic encephalopathy: Secondary | ICD-10-CM | POA: Diagnosis not present

## 2019-12-14 DIAGNOSIS — M533 Sacrococcygeal disorders, not elsewhere classified: Secondary | ICD-10-CM | POA: Diagnosis not present

## 2019-12-14 DIAGNOSIS — L0231 Cutaneous abscess of buttock: Secondary | ICD-10-CM | POA: Diagnosis not present

## 2019-12-14 DIAGNOSIS — R5381 Other malaise: Secondary | ICD-10-CM | POA: Diagnosis not present

## 2019-12-14 DIAGNOSIS — R627 Adult failure to thrive: Secondary | ICD-10-CM | POA: Diagnosis not present

## 2019-12-14 DIAGNOSIS — B9620 Unspecified Escherichia coli [E. coli] as the cause of diseases classified elsewhere: Secondary | ICD-10-CM | POA: Diagnosis not present

## 2019-12-14 DIAGNOSIS — R601 Generalized edema: Secondary | ICD-10-CM | POA: Diagnosis not present

## 2019-12-14 DIAGNOSIS — L89154 Pressure ulcer of sacral region, stage 4: Secondary | ICD-10-CM | POA: Diagnosis not present

## 2019-12-14 DIAGNOSIS — T8131XD Disruption of external operation (surgical) wound, not elsewhere classified, subsequent encounter: Secondary | ICD-10-CM | POA: Diagnosis not present

## 2019-12-14 DIAGNOSIS — N179 Acute kidney failure, unspecified: Secondary | ICD-10-CM | POA: Diagnosis not present

## 2019-12-14 DIAGNOSIS — I10 Essential (primary) hypertension: Secondary | ICD-10-CM | POA: Diagnosis not present

## 2019-12-14 DIAGNOSIS — U071 COVID-19: Secondary | ICD-10-CM | POA: Diagnosis not present

## 2019-12-14 DIAGNOSIS — M48061 Spinal stenosis, lumbar region without neurogenic claudication: Secondary | ICD-10-CM | POA: Diagnosis not present

## 2019-12-14 DIAGNOSIS — G9009 Other idiopathic peripheral autonomic neuropathy: Secondary | ICD-10-CM | POA: Diagnosis not present

## 2019-12-14 DIAGNOSIS — N390 Urinary tract infection, site not specified: Secondary | ICD-10-CM | POA: Diagnosis not present

## 2019-12-14 DIAGNOSIS — Z743 Need for continuous supervision: Secondary | ICD-10-CM | POA: Diagnosis not present

## 2019-12-14 DIAGNOSIS — N319 Neuromuscular dysfunction of bladder, unspecified: Secondary | ICD-10-CM | POA: Diagnosis not present

## 2019-12-15 DIAGNOSIS — K7460 Unspecified cirrhosis of liver: Secondary | ICD-10-CM | POA: Diagnosis not present

## 2019-12-15 DIAGNOSIS — R627 Adult failure to thrive: Secondary | ICD-10-CM | POA: Diagnosis not present

## 2019-12-15 DIAGNOSIS — R601 Generalized edema: Secondary | ICD-10-CM | POA: Diagnosis not present

## 2019-12-15 DIAGNOSIS — M533 Sacrococcygeal disorders, not elsewhere classified: Secondary | ICD-10-CM | POA: Diagnosis not present

## 2019-12-15 DIAGNOSIS — N390 Urinary tract infection, site not specified: Secondary | ICD-10-CM | POA: Diagnosis not present

## 2019-12-15 DIAGNOSIS — K7290 Hepatic failure, unspecified without coma: Secondary | ICD-10-CM | POA: Diagnosis not present

## 2019-12-15 DIAGNOSIS — N179 Acute kidney failure, unspecified: Secondary | ICD-10-CM | POA: Diagnosis not present

## 2019-12-15 DIAGNOSIS — L89154 Pressure ulcer of sacral region, stage 4: Secondary | ICD-10-CM | POA: Diagnosis not present

## 2019-12-15 DIAGNOSIS — N319 Neuromuscular dysfunction of bladder, unspecified: Secondary | ICD-10-CM | POA: Diagnosis not present

## 2019-12-16 DIAGNOSIS — K7290 Hepatic failure, unspecified without coma: Secondary | ICD-10-CM | POA: Diagnosis not present

## 2019-12-16 DIAGNOSIS — M533 Sacrococcygeal disorders, not elsewhere classified: Secondary | ICD-10-CM | POA: Diagnosis not present

## 2019-12-16 DIAGNOSIS — N179 Acute kidney failure, unspecified: Secondary | ICD-10-CM | POA: Diagnosis not present

## 2019-12-16 DIAGNOSIS — R601 Generalized edema: Secondary | ICD-10-CM | POA: Diagnosis not present

## 2019-12-16 DIAGNOSIS — R627 Adult failure to thrive: Secondary | ICD-10-CM | POA: Diagnosis not present

## 2019-12-16 DIAGNOSIS — K7460 Unspecified cirrhosis of liver: Secondary | ICD-10-CM | POA: Diagnosis not present

## 2019-12-16 DIAGNOSIS — N390 Urinary tract infection, site not specified: Secondary | ICD-10-CM | POA: Diagnosis not present

## 2019-12-16 DIAGNOSIS — L89154 Pressure ulcer of sacral region, stage 4: Secondary | ICD-10-CM | POA: Diagnosis not present

## 2019-12-16 DIAGNOSIS — N319 Neuromuscular dysfunction of bladder, unspecified: Secondary | ICD-10-CM | POA: Diagnosis not present

## 2019-12-20 ENCOUNTER — Other Ambulatory Visit: Payer: Self-pay

## 2019-12-20 NOTE — Patient Outreach (Signed)
[place] [facility]) Care Management  12/20/2019  Joel Klein 08-Mar-1961 [id number]     Transition of Care Referral  Referral Date: 12/20/2019 Referral Source: [facility] Discharge Report Date of Discharge: 12/17/2019 Facility: [place]: [facility]   Outreach attempt # 1 to patient. No answer at present. RN CM unable to leave message as voicemail box full.     Plan: RN CM will make outreach attempt to patient within 3-4 business days. RN CM will send unsuccessful outreach letter to patient.   Enzo Montgomery, RN,BSN,CCM [place] Management Telephonic Care Management Coordinator Direct Phone: [phone] Toll Free: [phone] Fax: [phone]

## 2019-12-21 ENCOUNTER — Other Ambulatory Visit: Payer: Self-pay

## 2019-12-21 NOTE — Patient Outreach (Signed)
[place] [facility]) Care Management  12/21/2019  Trase Bunda 1960-11-26 [id number]   Transition of Care Referral  Referral Date: 12/20/2019 Referral Source: [facility] Discharge Report Date of Discharge: 12/17/2019 Facility: [place] [facility] [place]: [facility]    Outreach attempt #2 to patient. Someone picked up the phone but would not say anything then hung up.     Plan: RN CM make outreach attempt to patient within 3-4 business days.   Enzo Montgomery, RN,BSN,CCM [place] Management Telephonic Care Management Coordinator Direct Phone: [phone] Toll Free: [phone] Fax: [phone]

## 2019-12-22 ENCOUNTER — Other Ambulatory Visit: Payer: Self-pay

## 2019-12-22 NOTE — Patient Outreach (Signed)
[place] [facility]) Care Management  12/22/2019  Joel Klein 04-Apr-1961 [id number]   Transition of Care Referral  Referral Date:12/20/2019 Referral Source:[employer] Discharge Report Date of Discharge:12/17/2019 [place] [facility] [place] Medicare   Outreach attempt #3 to patient. No answer at present.      Plan: RN CM will close case if no response from letter sent to patient.    Enzo Montgomery, RN,BSN,CCM [place] Management Telephonic Care Management Coordinator Direct Phone: [phone] Toll Free: [phone] Fax: [phone]

## 2019-12-26 DEATH — deceased

## 2020-01-03 ENCOUNTER — Other Ambulatory Visit: Payer: Self-pay

## 2020-01-03 NOTE — Patient Outreach (Signed)
[place] [facility]) Care Management  01/03/2020  [name] [birth year] [id number]   Transition of Care Referral  Referral Date:12/20/2019 Referral Source:[employer] Discharge Report Date of Discharge:12/17/2019 [place] Medicare   Multiple attempts to establish contact with patient without success. No response from letter mailed to patient. Case is being closed at this time.     Plan: RN CM will close case at this time.   Enzo Montgomery, RN,BSN,CCM [place] Management Telephonic Care Management Coordinator Direct Phone: [phone] Toll Free: [phone] Fax: [phone]

## 2021-06-08 IMAGING — MR MRI CERVICAL SPINE WITHOUT CONTRAST
4 of 5 series · 27 of 48 positions shown · non-contrast
Comparison: 08/27/2014

CLINICAL DATA: Chronic neck and back pain.  Numbness and tingling.

EXAM:
MRI CERVICAL SPINE WITHOUT CONTRAST
TECHNIQUE: Multiplanar, multisequence MR imaging of the cervical spine was
performed. No intravenous contrast was administered.

[Series 20: T1 · sagittal · 3.0mm · 0.66mm/px · 7 of 15 slices shown]
[im 1/15]
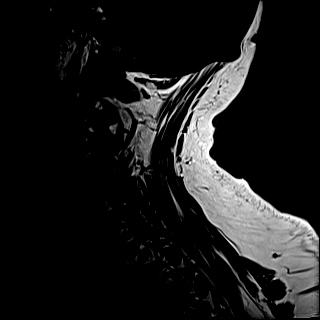
[im 3/15]
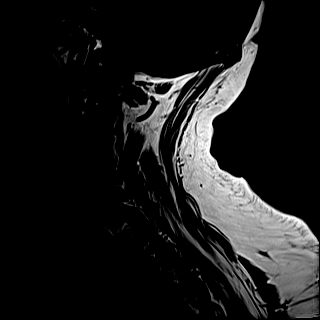
[im 5/15]
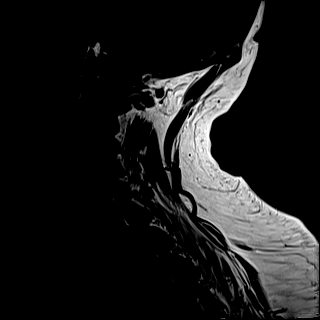
[im 8/15]
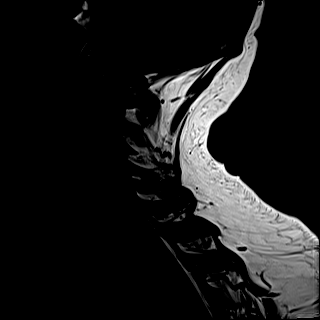
[im 10/15]
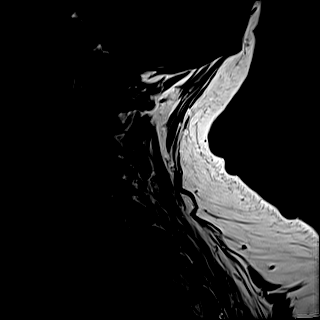
[im 12/15]
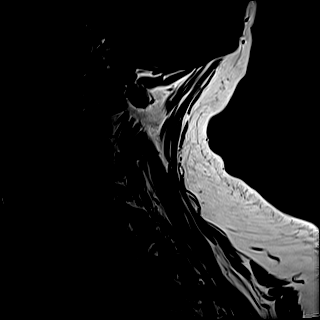
[im 15/15]
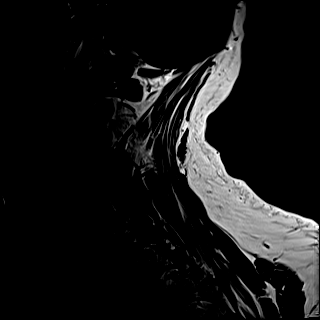

[Series 21: T2 · sagittal · 3.0mm · 0.55mm/px · 7 of 15 slices shown (1 of 2)]
[im 1/15]
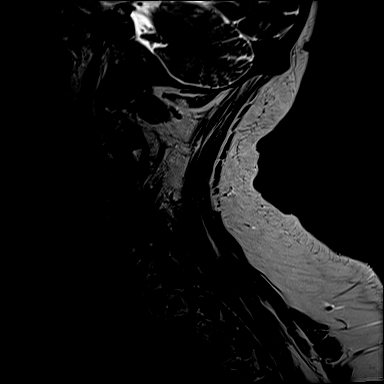
[im 3/15]
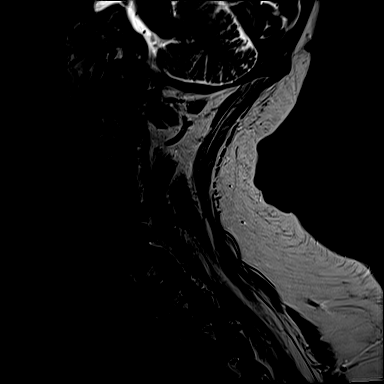
[im 5/15]
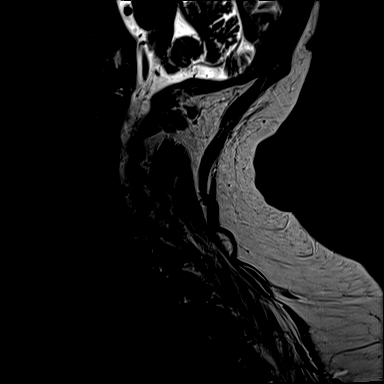
[im 8/15]
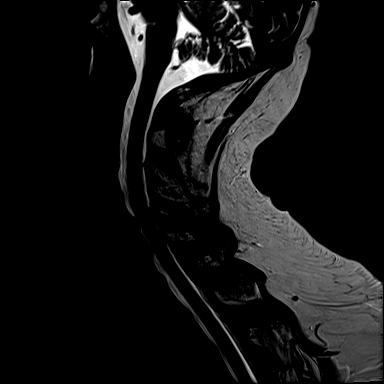
[im 10/15]
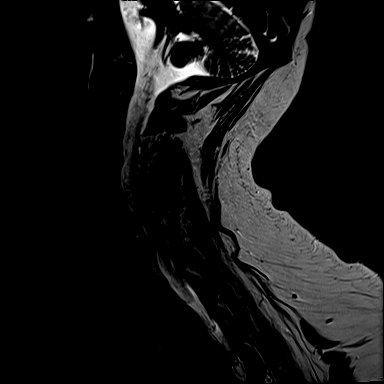
[im 12/15]
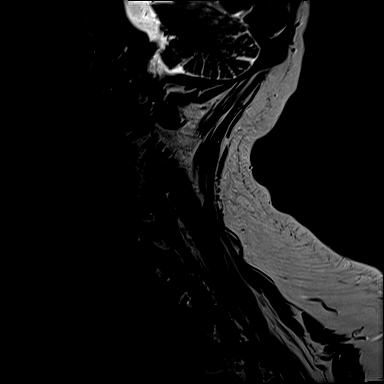
[im 15/15]
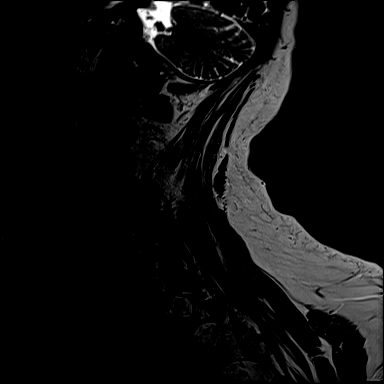

[Series 22: STIR · sagittal · 3.0mm · 0.33mm/px · 5 of 15 slices shown]
[im 1/15]
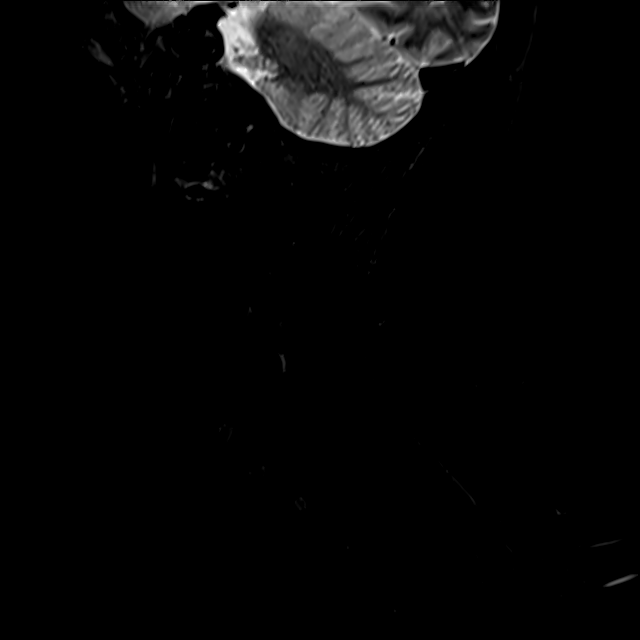
[im 3/15]
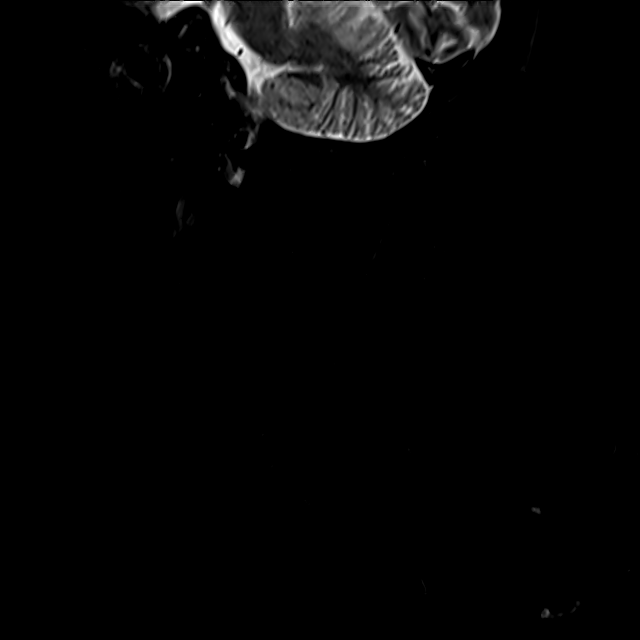
[im 6/15]
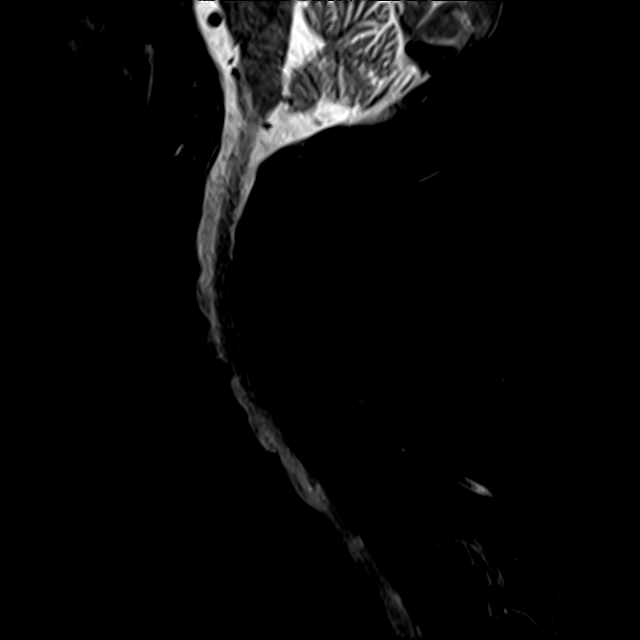
[im 9/15]
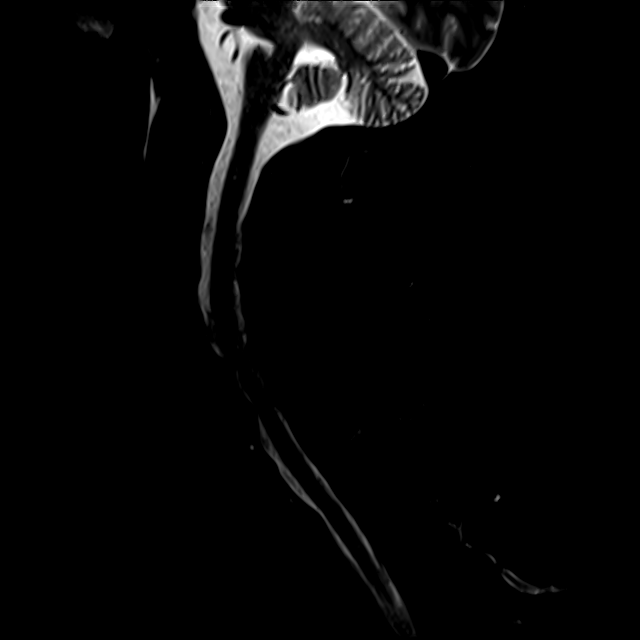
[im 15/15]
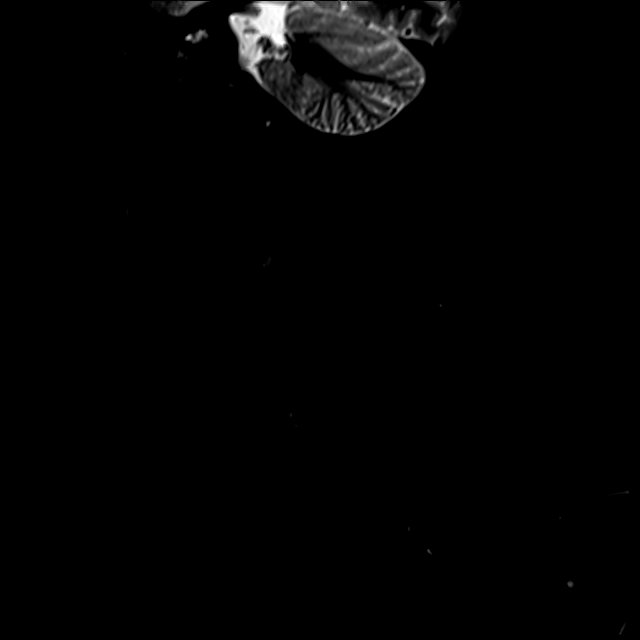

[Series 23: T2 · axial · 3.0mm · 0.50mm/px · z∈[-44,+60]mm · 8 of 33 slices shown (2 of 2)]
[im 1/33]
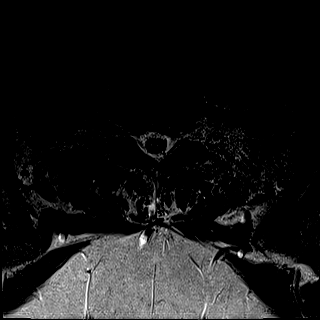
[im 5/33]
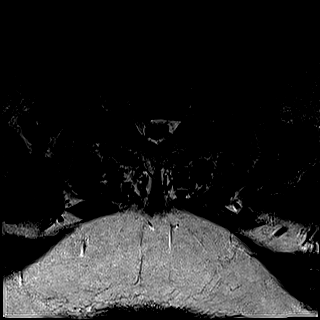
[im 10/33]
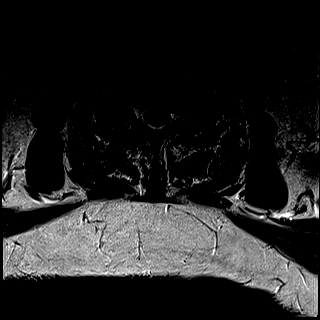
[im 15/33]
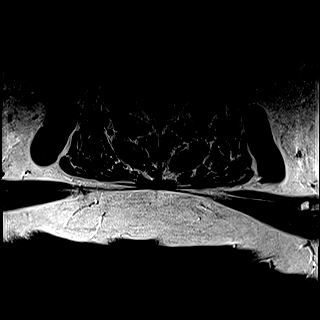
[im 18/33]
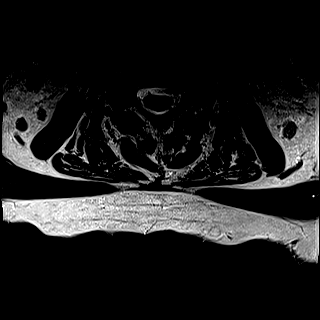
[im 23/33]
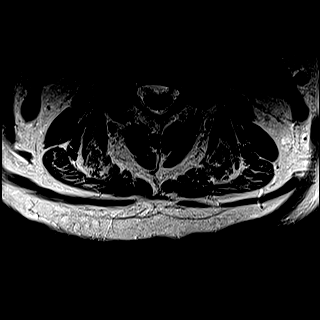
[im 28/33]
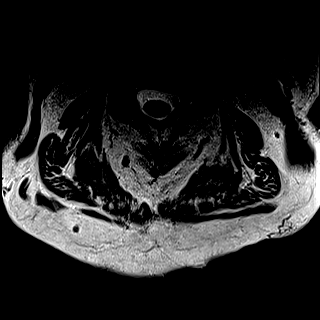
[im 33/33]
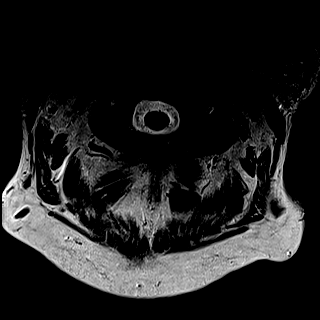

[27 of 48 positions shown; findings below may reference images not displayed]

FINDINGS: Alignment: Normal

Vertebrae: Normal

Cord: No primary cord lesion.

Posterior Fossa, vertebral arteries, paraspinal tissues: Negative

Disc levels:

No abnormality at the foramen magnum, C1-2 or C2-3.

C3-4: Endplate osteophytes and bulging of the disc slightly more
prominent towards the left. No canal stenosis. Foraminal narrowing
left more than right. Some potential this could affect the left C4
nerve.

C4-5: Mild uncovertebral hypertrophy. No central canal stenosis.
Bilateral foraminal narrowing right more than left. Some potential
for neural compression.

C5-6: Spondylosis with endplate osteophytes and bulging of the disc.
Effacement of the subarachnoid space. AP diameter of the canal in
the midline 9 mm. No frank cord compression. Bilateral foraminal
stenosis could affect either C6 nerve.

C6-7: Endplate osteophytes and disc protrusion more prominent
towards the left. Effacement of the subarachnoid space particularly
on the left. AP diameter of the canal in the midline measures 9 mm.
Foraminal stenosis left worse than right. Either C7 nerve could be
compressed, particularly the left.

C7-T1: Shallow disc protrusion more prominent towards the right. No
compressive canal stenosis. Proximal foraminal encroachment on the
right but without visible neural compression

Compared to the study of 4639, findings are quite similar, with
exception that the right-sided disc protrusion at C7-T1 is newly
seen.
IMPRESSION: Chronic degenerative spondylosis at C5-6 and C6-7. Canal stenosis
with effacement of the subarachnoid space but no cord compression.
AP diameter in the midline 9 mm at each level. Bilateral foraminal
stenosis that could cause neural compression on either side.
Findings are worse on the left at C6-7.

Degenerative spondylosis at C3-4 and C4-5 with mild foraminal
narrowing on the left at C3-4 and right more than left at C4-5 that
could possibly be symptomatic.

Newly seen small right posterolateral disc protrusion at C7-T1. This
does encroach upon the proximal foramen and could possibly affect
the right C8 nerve.
